# Patient Record
Sex: Female | Born: 1935 | Race: White | Hispanic: No | Smoking: Never smoker
Health system: Southern US, Community
[De-identification: ages and names within clinical notes are randomized; demographics above are authoritative.]

## PROBLEM LIST (undated history)

## (undated) DIAGNOSIS — E785 Hyperlipidemia, unspecified: Secondary | ICD-10-CM

## (undated) DIAGNOSIS — E039 Hypothyroidism, unspecified: Secondary | ICD-10-CM

## (undated) DIAGNOSIS — F0390 Unspecified dementia without behavioral disturbance: Secondary | ICD-10-CM

## (undated) DIAGNOSIS — R7303 Prediabetes: Secondary | ICD-10-CM

## (undated) DIAGNOSIS — I10 Essential (primary) hypertension: Secondary | ICD-10-CM

## (undated) DIAGNOSIS — M1990 Unspecified osteoarthritis, unspecified site: Secondary | ICD-10-CM

## (undated) HISTORY — DX: Unspecified osteoarthritis, unspecified site: M19.90

## (undated) HISTORY — DX: Hypothyroidism, unspecified: E03.9

## (undated) HISTORY — DX: Prediabetes: R73.03

## (undated) HISTORY — DX: Essential (primary) hypertension: I10

## (undated) HISTORY — DX: Hyperlipidemia, unspecified: E78.5

## (undated) HISTORY — PX: FLEXIBLE SIGMOIDOSCOPY: SHX1649

## (undated) HISTORY — PX: CARDIAC CATHETERIZATION: SHX172

---

## 1983-08-11 HISTORY — PX: TOTAL ABDOMINAL HYSTERECTOMY: SHX209

## 1983-08-11 HISTORY — PX: CORNEAL TRANSPLANT: SHX108

## 1988-08-10 HISTORY — PX: MYRINGOTOMY: SHX2060

## 1989-08-10 HISTORY — PX: CORNEAL TRANSPLANT: SHX108

## 1993-08-10 HISTORY — PX: KNEE ARTHROSCOPY: SUR90

## 1998-01-15 ENCOUNTER — Ambulatory Visit (HOSPITAL_COMMUNITY): Admission: RE | Admit: 1998-01-15 | Discharge: 1998-01-15 | Payer: Self-pay | Admitting: *Deleted

## 1998-11-19 ENCOUNTER — Other Ambulatory Visit: Admission: RE | Admit: 1998-11-19 | Discharge: 1998-11-19 | Payer: Self-pay | Admitting: *Deleted

## 1999-01-17 ENCOUNTER — Ambulatory Visit (HOSPITAL_COMMUNITY): Admission: RE | Admit: 1999-01-17 | Discharge: 1999-01-17 | Payer: Self-pay | Admitting: *Deleted

## 1999-08-11 HISTORY — PX: RETINAL DETACHMENT SURGERY: SHX105

## 1999-08-11 HISTORY — PX: ENTEROCELE REPAIR: SHX623

## 1999-11-27 ENCOUNTER — Other Ambulatory Visit: Admission: RE | Admit: 1999-11-27 | Discharge: 1999-11-27 | Payer: Self-pay | Admitting: *Deleted

## 2000-01-26 ENCOUNTER — Ambulatory Visit (HOSPITAL_COMMUNITY): Admission: RE | Admit: 2000-01-26 | Discharge: 2000-01-26 | Payer: Self-pay | Admitting: *Deleted

## 2000-07-15 ENCOUNTER — Encounter (INDEPENDENT_AMBULATORY_CARE_PROVIDER_SITE_OTHER): Payer: Self-pay | Admitting: Specialist

## 2000-07-15 ENCOUNTER — Inpatient Hospital Stay (HOSPITAL_COMMUNITY): Admission: RE | Admit: 2000-07-15 | Discharge: 2000-07-18 | Payer: Self-pay | Admitting: Gynecology

## 2000-11-29 ENCOUNTER — Encounter: Admission: RE | Admit: 2000-11-29 | Discharge: 2000-11-29 | Payer: Self-pay | Admitting: Internal Medicine

## 2000-11-29 ENCOUNTER — Encounter: Payer: Self-pay | Admitting: Internal Medicine

## 2001-03-11 ENCOUNTER — Encounter: Payer: Self-pay | Admitting: Internal Medicine

## 2001-03-11 ENCOUNTER — Ambulatory Visit (HOSPITAL_COMMUNITY): Admission: RE | Admit: 2001-03-11 | Discharge: 2001-03-11 | Payer: Self-pay | Admitting: Internal Medicine

## 2001-03-13 ENCOUNTER — Encounter: Payer: Self-pay | Admitting: Emergency Medicine

## 2001-03-13 ENCOUNTER — Emergency Department (HOSPITAL_COMMUNITY): Admission: EM | Admit: 2001-03-13 | Discharge: 2001-03-13 | Payer: Self-pay | Admitting: Unknown Physician Specialty

## 2001-06-01 ENCOUNTER — Other Ambulatory Visit: Admission: RE | Admit: 2001-06-01 | Discharge: 2001-06-02 | Payer: Self-pay | Admitting: Obstetrics and Gynecology

## 2001-06-06 ENCOUNTER — Encounter: Admission: RE | Admit: 2001-06-06 | Discharge: 2001-06-29 | Payer: Self-pay | Admitting: Orthopedic Surgery

## 2001-06-29 ENCOUNTER — Encounter: Payer: Self-pay | Admitting: Obstetrics and Gynecology

## 2001-06-29 ENCOUNTER — Encounter: Admission: RE | Admit: 2001-06-29 | Discharge: 2001-06-29 | Payer: Self-pay | Admitting: Obstetrics and Gynecology

## 2001-08-17 ENCOUNTER — Ambulatory Visit (HOSPITAL_COMMUNITY): Admission: RE | Admit: 2001-08-17 | Discharge: 2001-08-17 | Payer: Self-pay | Admitting: Gastroenterology

## 2002-08-10 HISTORY — PX: LIPOMA EXCISION: SHX5283

## 2002-09-22 ENCOUNTER — Ambulatory Visit (HOSPITAL_COMMUNITY): Admission: RE | Admit: 2002-09-22 | Discharge: 2002-09-22 | Payer: Self-pay | Admitting: Internal Medicine

## 2002-09-22 ENCOUNTER — Encounter: Payer: Self-pay | Admitting: Internal Medicine

## 2002-10-13 ENCOUNTER — Ambulatory Visit (HOSPITAL_BASED_OUTPATIENT_CLINIC_OR_DEPARTMENT_OTHER): Admission: RE | Admit: 2002-10-13 | Discharge: 2002-10-13 | Payer: Self-pay | Admitting: Surgery

## 2002-11-16 ENCOUNTER — Ambulatory Visit (HOSPITAL_COMMUNITY): Admission: RE | Admit: 2002-11-16 | Discharge: 2002-11-16 | Payer: Self-pay | Admitting: *Deleted

## 2002-12-01 ENCOUNTER — Encounter (INDEPENDENT_AMBULATORY_CARE_PROVIDER_SITE_OTHER): Payer: Self-pay | Admitting: *Deleted

## 2002-12-01 ENCOUNTER — Ambulatory Visit (HOSPITAL_BASED_OUTPATIENT_CLINIC_OR_DEPARTMENT_OTHER): Admission: RE | Admit: 2002-12-01 | Discharge: 2002-12-01 | Payer: Self-pay | Admitting: Surgery

## 2003-06-21 ENCOUNTER — Ambulatory Visit (HOSPITAL_COMMUNITY): Admission: RE | Admit: 2003-06-21 | Discharge: 2003-06-21 | Payer: Self-pay | Admitting: Internal Medicine

## 2003-10-05 ENCOUNTER — Emergency Department (HOSPITAL_COMMUNITY): Admission: EM | Admit: 2003-10-05 | Discharge: 2003-10-05 | Payer: Self-pay | Admitting: Emergency Medicine

## 2005-06-09 ENCOUNTER — Ambulatory Visit (HOSPITAL_COMMUNITY): Admission: RE | Admit: 2005-06-09 | Discharge: 2005-06-09 | Payer: Self-pay | Admitting: Internal Medicine

## 2005-06-17 ENCOUNTER — Encounter: Admission: RE | Admit: 2005-06-17 | Discharge: 2005-06-17 | Payer: Self-pay | Admitting: Internal Medicine

## 2006-07-20 ENCOUNTER — Ambulatory Visit (HOSPITAL_COMMUNITY): Admission: RE | Admit: 2006-07-20 | Discharge: 2006-07-20 | Payer: Self-pay | Admitting: Internal Medicine

## 2007-07-30 ENCOUNTER — Emergency Department (HOSPITAL_COMMUNITY): Admission: EM | Admit: 2007-07-30 | Discharge: 2007-07-30 | Payer: Self-pay | Admitting: *Deleted

## 2009-01-17 ENCOUNTER — Encounter: Admission: RE | Admit: 2009-01-17 | Discharge: 2009-01-17 | Payer: Self-pay | Admitting: Internal Medicine

## 2010-02-20 ENCOUNTER — Ambulatory Visit (HOSPITAL_COMMUNITY): Admission: RE | Admit: 2010-02-20 | Discharge: 2010-02-20 | Payer: Self-pay | Admitting: Internal Medicine

## 2010-07-23 ENCOUNTER — Ambulatory Visit (HOSPITAL_COMMUNITY)
Admission: RE | Admit: 2010-07-23 | Discharge: 2010-07-23 | Payer: Self-pay | Source: Home / Self Care | Attending: Internal Medicine | Admitting: Internal Medicine

## 2010-08-31 ENCOUNTER — Encounter: Payer: Self-pay | Admitting: Internal Medicine

## 2010-12-26 NOTE — Op Note (Signed)
[facility]  Patient:    Stephanie Weeks, Stephanie Weeks               MRN: [id number] Proc. Date: 07/15/00 Attending:  Gretta Cool, M.D. CC:         Esmeralda Arthur, M.D.  Amil Amen L. Lenon Ahmadi, M.D.   Operative Report  DATE OF BIRTH:  11/11/1932.  PREOPERATIVE DIAGNOSES:  Grade 4 pelvic organ prolapse with enormous enterocele and rectocele, anterior enterocele, bilateral paravaginal defects, vaginal vault prolapse.  POSTOPERATIVE DIAGNOSES:  Grade 4 pelvic organ prolapse with enormous enterocele and rectocele, anterior enterocele, bilateral paravaginal defects, vaginal vault prolapse plus small bowel adhesion in the enterocele sac.  PROCEDURES:  Paravaginal plus Burch procedure, posterior and enterocele repairs with release of small bowel adhesion from the enterocele sac, vaginal vault suspension.  SURGEON:  Gretta Cool, M.D.  ASSISTANT:  Raynald Kemp, M.D.  ANESTHESIA:  General endotracheal.  DESCRIPTION OF PROCEDURE:  Under excellent anesthesia, as above, with the patients prepped and draped in Allen stirrups, a Pfannenstiel incision was made and extended through the fascia.  The rectus muscles were then separated in the midline and the retroperitoneal space opened.  The adventitial fatty tissue was then removed so as to expose clean fascia.  Bilateral paravaginal defects were noted.  The white line fascia of the pelvis was identified and the ischial spines and other landmarks identified.  Sutures were then placed approximately a centimeter apart from a centimeter below the ischial spine through the endopelvic fascia, full thickness, and each suture was secured to the white line fascia of the pelvis at its point of detachment; approximately seven sutures were placed on the right and six on the left; 0 Ethibond was used.  The Burch procedure was then undertaken with two sutures placed on each side with 0 Ethibond, secured to Coopers  ligament.  At the end of the procedure, the anterior vaginal wall was well-supported, cystocele was completely reduced and the level 2 support restored.  The urethra and vesical neck were elevated to a high intra-abdominal position.  At this point, bleeding was well-controlled.  The retropubic space was irrigated and the rectus muscles then plicated in the midline with a running suture of 0  Monocryl, the fascia was approximated with 0 Vicryl from each angle to the midline, subcutaneous tissue was approximated with interrupted sutures of 3-0 Vicryl and the skin was closed with skin staples and Steri-Strips.  At this point, attention was turned to the repair of the enormous enterocele and rectocele and vaginal vault suspension.  The patient was repositioned and the vaginal portion begun.  The vaginal mucosa was grasped at the introitus and incised from the introitus to the apex of the vaginal cuff.  The mucosa was then dissected free from the underlying endopelvic fascia and perirectal fascia.  An enormous enterocele was encountered and that bulged through the introitus.  There was evidence of anterior enterocele as well and detachment of the anterior vaginal wall fascia from the apex of the cuff.  The apex of the cuff was dissected free and the enterocele sac was released.  Upon opening the enterocele sac, a loop of small bowel was identified, adherent to the very depth of the enterocele sac.  The small bowel loop was released and the serosa repaired with a single interrupted suture of 3-0 Monocryl.  The bowel loop was released high into the abdomen.  The enterocele sac was then ligated as high as possible with a  running pursestring suture of 0 Monocryl.  The cardinal-uterosacral complex was then identified and plicated in the midline with interrupted sutures of 0 Ethibond.  The endopelvic fascia was then closed with reapproximation of the anterior vaginal wall fascia to the posterior  perirectal fascia.  The cuff was then secured as high as possible behind the uterosacral-cardinal complex suspension.  The fascia was secured to the uterosacral-cardinal complex suspension with interrupted sutures of 0 Vicryl.  The perirectal fascia was then plicated from the apex of the vagina to the introitus using 0 Vicryl. The mucosa were then trimmed and the upper layers of endopelvic fascia approximated with a subcuticular closure of 2-0 Vicryl, including the upper layers of endopelvic fascia.  At the end of the procedure, the vagina remained an adequate bi-digital introitus with excellent caliber and length.  At this point, the bladder was filled with approximately 400 cc of irrigation fluid and Bonnano suprapubic Cystocath placed.  The catheter was secured with interrupted sutures of 0 Ethibond.  At the end of the procedure, sponge and lap counts were correct.  There were no complications.  Blood loss estimate: Approximately 300 cc.  Patient returned to the recovery room in excellent condition without complication. DD:  07/15/00 TD:  07/16/00 Job: [id number]

## 2010-12-26 NOTE — H&P (Signed)
[facility]  Patient:    Stephanie Weeks, Stephanie Weeks               MRN: [id number] Attending:  Gretta Weeks, M.D. CC:         Stephanie Weeks, M.D.  Stephanie Weeks, M.D.   History and Physical  REASON FOR ADMISSION:  [name] is referred by Dr. Esmeralda Weeks for evaluation and treatment of pelvic organ prolapse.  HISTORY OF PRESENT ILLNESS:  [age]-year-old gravida 4, para 4, referred for severe pelvic organ prolapse.  She has a history of hysterectomy in 1985, total abdominal, and right salpingo-oophorectomy and cautery of endometriosis. She did well until approximately four years ago, when she began to experience symptoms of pelvic organ prolapse.  She has had progressive relaxation to the point now of grade 4 prolapse of vaginal vault and enterocele.  She has used a donut-type pessary, which she was unable to tolerate; she subsequently had it removed.  Over the last several years, she has had progressively worsening symptoms and now has severe pain with prolonged standing; she also has a mixed pattern of incontinence with both urge and stress component.  Her stress component is by far worse.  She has very frequent voiding and difficulty emptying, more than eight times daily.  She has to splint to empty her rectum and occasionally to empty her bladder.  She requires pads at all times.  She has pain with intercourse to the point of no longer participating.   She has difficulty evacuating her bowels and chronic constipation.  On assessment in our office, she has a high-grade cystocele with bilateral paravaginal defects and enormous enterocele with prolapse of the vaginal vault to the introitus. Her enterocele bulges approximately 4 or 5 cm beyond the introitus with straining.  She has had a Milex ring pessary placed as a barrier test and although it reduces the enterocele, it worsened her incontinence.  She has normal bladder  capacity and only a 10 cc residual urine volume.  She is now admitted for surgical repair.  PAST MEDICAL HISTORY:  Usual childhood diseases without sequelae.  Medical illnesses:  Severe eye problems with recurrent surgery for cataracts with lens implants, corneal transplant, therapy for glaucoma and retinal detachment. She is under the primary care of Dr. Amil Amen L. Weeks.  She has a history of hypertension, on verapamil.  She is hypothyroid, on replacement with Synthroid 0.05 mg daily, on hormone-replacement therapy with Premarin 0.625 mg and vaginal estrogen, takes Lorazepam 1 mg one and a half tablets for sleep.  PREVIOUS SURGERY:  Hysterectomy in 1985, abdominal, and right salpingo-oophorectomy; multiple eye surgeries, 1985 to 1999; surgery for breast lumps, 1987 and 1989, benign, by Dr. Sheppard Weeks. Stephanie Weeks.  FAMILY HISTORY:  Father died of congestive heart failure.  Mother died of stroke.  She has a strongly positive family history for coronary disease and hypertension.  One sister with ovarian cancer.  SOCIAL HISTORY:  Patient is a retired Pensions consultant for [employer]. Husband works for [employer].  REVIEW OF SYSTEMS:  HEENT:  Denies symptoms.  CARDIORESPIRATORY:  Denies asthma, cough, bronchitis, shortness of breath.  GI/GU:  Denies frequency, urgency, dysuria, change in bowel habits, food intolerance.  She has a hiatal hernia with reflux and is on Prilosec from Stephanie Weeks; also has a history of spastic colon, treated by Dr. Matthias Weeks.  PHYSICAL EXAMINATION  GENERAL:  Well-developed, well-nourished white female.  HEENT:  Pupils unequal; both react to  light but her right pupil is more fixed -- sequelae of multiple eye surgeries, retinal detachment, right eye.  NECK:  Supple without mass or thyroid enlargement.  CHEST:  Clear P to A.  HEART:  Regular rhythm without murmur or cardiac enlargement.  BREASTS:  Soft, without mass, nodes or nipple  discharge.  ABDOMEN:  Soft, scaphoid.  Without mass or organomegaly.  Previous Pfannenstiel incision.  PELVIC:  External genitalia:  Normal female.  Vagina:  Severe pelvic organ prolapse with grade 4 enterocele with prolapse of the vaginal vault to and beyond the introitus; probable anterior enterocele as well with bilateral paravaginal defects and large rectocele.  Cervix and uterus are surgically absent.  Adnexa nonpalpable.  RECTAL:  Her sphincter remains intact.  Rectovaginal exam confirms.  IMPRESSIONS 1. Severe pelvic organ prolapse with enterocele, grade 4; vaginal vault    prolapse, grade 3; anterior enterocele; bilateral paravaginal defects; and    rectocele, grade 3. 2. History of hysterectomy in 1985 for endometriosis, and right    salpingo-oophorectomy. 3. Mixed incontinence secondary to above, predominantly stress. 4. History of retinal detachment with multiple eye surgeries and lens    implants. 5. History of hiatal hernia. 6. History of irritable bowel syndrome. 7. History of abnormal genital cytology, resolved. 8. Hypertension. 9. Hypothyroid.  PLAN:  Patient had preoperative cardiology consultation and clearance by Dr. Vesta Weeks, Stephanie Weeks.  She understands the alternatives including no therapy and use of pessary.  Because of worsened incontinence, she has petitioned for surgical repairs.  She is now admitted for paravaginal plus Burch procedure, vaginal vault suspension, posterior and enterocele repairs. She understands the risk/benefit ratio, the medical risks, alternatives and now admitted for definitive therapy. DD:  07/15/00 TD:  07/16/00 Job: [id number]

## 2010-12-26 NOTE — Procedures (Signed)
[facility]  Patient:    RAVIN, BENDALL Visit Number: [id number] MRN: [id number]          Service Type: END Location: ENDO Attending Physician:  Rich Brave Dictated by:   Florencia Reasons, M.D. Proc. Date: 08/17/01 Admit Date:  08/17/2001   CC:         Marinus Maw, M.D.  Gretta Cool, M.D.   Procedure Report  PROCEDURE:  Colonoscopy.  ENDOSCOPIST:  Florencia Reasons, M.D.  INDICATION:  [age]-year-old female with prior history of colonic adenoma removed on colonoscopy about six years ago.  FINDINGS:  Normal exam to the cecum.  DESCRIPTION OF PROCEDURE:  The nature, purpose and risks of the procedure were familiar to the patient from prior examination; she provided written consent. Sedation was fentanyl 125 mcg and Versed 12 mg IV, without arrhythmias or desaturation.  The Olympus adjustable-tension pediatric video colonoscope was advanced without too much difficulty to the cecum, taking out loops to facilitate advancement.  The cecum was identified by clear visualization of the ileocecal valve.  Pullback was then performed.  The quality of the prep was very good and it was felt that all areas were well-seen.  This was a normal examination.  No polyps, cancer, colitis, vascular malformations or diverticulosis were observed.  Retroflexion in the rectum showed some hypertrophied anal papillae but was otherwise normal.  No biopsies were obtained.  The patient tolerated the procedure well and there were no apparent complications.  IMPRESSION:  Normal surveillance colonoscopy in a patient with a prior history of a small colonic adenoma.  PLAN:  Followup colonoscopy in five years. Dictated by:   Florencia Reasons, M.D. Attending Physician:  Rich Brave DD:  08/17/01 TD:  08/17/01 Job: [id number]

## 2010-12-26 NOTE — Op Note (Signed)
   NAME:  Stephanie Weeks, Stephanie Weeks NO.:  [id number]   MEDICAL RECORD NO.:  [id number]                   PATIENT TYPE:  AMB   LOCATION:                                       FACILITY:  [facility]   PHYSICIAN:  Thornton Park. Daphine Deutscher, M.D.             DATE OF BIRTH:  [birth year]   DATE OF PROCEDURE:  12/01/2002  DATE OF DISCHARGE:                                 OPERATIVE REPORT   POSTOPERATIVE DIAGNOSIS:  Two masses, left portion of back.   POSTOPERATIVE DIAGNOSIS:  Lateral lipoma, medial chronic draining sebaceous  cyst.   PROCEDURE:  Excision of two masses of back.   SURGEON:  Thornton Park. Daphine Deutscher, M.D.   ANESTHESIA:  MAC with local.   DESCRIPTION OF PROCEDURE:  The patient is a 77 year old lady who was taken  to room eight, [facility] day surgery, on April 23 and placed in the slightly  elevated prone position. The back was prepped with Betadine and draped  sterilely.  Previously, I had localized these areas.  I infiltrated the  lipoma area first with 1% lidocaine and made a small transverse incision and  was able to dissect this down and shell it out.  Bleeding was controlled  with the electrocautery.  The wound was closed with 4-0 Vicryl  subcutaneously and subcuticularly with Benzoin and Steri-Strips.   Next, again through a transverse incision I infiltrated this large dilated  pore that had some material trying to drain out of it. I injected this with  a little lidocaine and then made an elliptical incision and excised this  whole chronically inflamed thing.  This mass was sent for permanent  sections. Again, the wound had bleeding controlled with ithe electrocautery  and it was closed with 4-0 Vicryl subcutaneously with Benzoin and Steri-  Strips.  The patient seemed to tolerate the procedure well.  Sterile  dressings were applied.  The patient was given Vicodin to take for pain and  will be followed up in the office in approximately three weeks.                                Thornton Park Daphine Deutscher, M.D.    MBM/MEDQ  D:  12/01/2002  T:  12/03/2002  Job:  [id number]   cc:   Jeoffrey Massed, M.D.  [facility]  [place], [place] [address]  Fax: [phone]

## 2010-12-26 NOTE — Cardiovascular Report (Signed)
NAME:  Stephanie Weeks, Stephanie Weeks                ACCOUNT NO.:  [id number]   MEDICAL RECORD NO.:  [id number]                   PATIENT TYPE:  OIB   LOCATION:  2899                                 FACILITY:  [facility]   PHYSICIAN:  Veneda Melter, M.D. [facility]               DATE OF BIRTH:  1933/02/13   DATE OF PROCEDURE:  11/16/2002  DATE OF DISCHARGE:  11/16/2002                              CARDIAC CATHETERIZATION   PROCEDURES PERFORMED:  1. Left heart catheterization.  2. Left ventriculogram.  3. Selective coronary angiography.  4. Abdominal aortogram.  5. Perclose, right femoral artery.   DIAGNOSES:  1. Mild coronary artery disease by angiogram.  2. Normal left ventricular systolic function.   HISTORY:  The patient is a 77 year old white female who presented for  assessment prior to resection of a left scapular mass.  The patient had an  abnormal ECG and nondescript dyspnea on exertion.  She underwent a stress  imaging study, suggesting ischemia in the anterior apical wall.  She  presents now for further assessment.   TECHNIQUE:  Informed consent was obtained. The patient was brought to the  catheterization lab.  A 6-French sheath was placed in the right femoral  artery using modified Seldinger technique. A 6-French JL4 and JR4 catheters  were then used to engage the left and right coronary arteries.  Selective  angiography was then performed using manual injection of contrast.  Left-  sided pigtail catheter was advanced to the left ventricle, and a left  ventriculogram was then performed using power injection of contrast.  The  pigtail catheter was brought back into the descending aorta, and an  abdominal aortogram performed using power injections of contrast.  At the  termination of the case, the catheters and sheaths were removed, and the  Perclose suture closure device deployed to the right femoral artery until  adequate hemostasis was achieved.  The patient tolerated the procedure  well  and was transferred to the floor in stable condition.   FINDINGS:  1. Left main trunk short angiograph and normal.  2. LAD.  This is a medium-caliber vessel; it provides a large diagonal     branch in the proximal segment and then extends to the apex.  There is a     mild concentric narrowing of 30% in the mid section after the take off of     a major septal perforator.  The distal LAD has mild corkscrewing and     tapers as it wraps around the apex.  3. Left circumflex artery.  This is codominant and a large-caliber vessel     that provides two marginal branches to the proximal segment, two     posteroventricular branches, and a small PDA distally.  The left     circumflex system has mild disease; there are narrowings of 30% in the     first two marginal branches in the proximal segment.  4. Right coronary artery is codominant.  This is a small-caliber vessel that     provides a large RV marginal branch, which extends to the distal inferior     septum.  Mild tortuosity is noted in the mid right coronary artery as     well as in the distal segment of the RV marginal branch, and there are     mild irregularities, not greater than 20 to 30%.  5. LD.  Normal end-systolic and end-diastolic dimensions.  Overall, left     ventricular function is well preserved.  Ejection fraction is greater     than 55%.  There is no mitral regurgitation.  LV pressure is 160/10,     aortic is 160/75, LVEDP equals 15.  6. Abdominal aorta is of a normal caliber.  There is mild atheromatous     buildup without critical stenosis or dilatation.  Iliac arteries are     patent bilaterally.  The renal arteries are single and mildly patent     bilaterally.   ASSESSMENT/PLAN:  The patient is a 77 year old white female with mild  coronary artery disease and well-preserved LV function by angiogram.  The  patient is considered at low risk for cardiovascular events during her  planned surgery, and continued medical  therapy will be pursued to reduce her  risks of future cardiovascular events.                                               Veneda Melter, M.D. [facility]    NG/MEDQ  D:  11/16/2002  T:  11/17/2002  Job:  [id number]   cc:   Lucky Cowboy, M.D.  [address], [address]  [place], [place] [address]  Fax: [phone]   Dr. Daphine Deutscher, [facility]

## 2010-12-26 NOTE — Discharge Summary (Signed)
[facility]  Patient:    Stephanie Weeks, Stephanie Weeks               MRN: [id number] Adm. Date:  07/15/00 Disc. Date: 07/18/00 Attending:  Gretta Cool, M.D. CC:         Esmeralda Arthur, M.D.  Amil Amen L. Lenon Ahmadi, M.D.   Discharge Summary  HISTORY OF PRESENT ILLNESS:  The patient is a 77 year old female, G4, P4 who was referred to our office by Dr. Katrinka Blazing for severe pelvic organ prolapse.  In 1985, she had a total abdominal hysterectomy and a right salpingo-oophorectomy and cautery of endometriosis.  She apparently did well for four years and then began to experience pelvic organ prolapse.  She has had progressive relaxation to the point of grade 4 with vaginal vault and enterocele.  Doughnut type pessary was attempted to relieve symptoms without success.  She now returns with severe pain with prolonged standing as well as mixed pattern of incontinence with both urge and stress component.  She reports very frequent voiding and difficulty emptying which she has to splint to empty her rectum and occasionally to empty her bladder.  She wears pads at all times.  She has pain with intercourse to the point that she no longer can tolerate it.  Upon examination in the office, she has a high grade cystocele with bilateral paravaginal defects and enormous enterocele with prolapse of the vaginal vault to the introitus.  The enterocele bulge is approximately 4-5 cm beyond the introitus with straining.  She has a Milex ring pessary placed as a barrier test and although it reduces the enterocele, it worsens her incontinence.  She has normal bladder capacity with only 10 cc residual urine volume.  She is now admitted for surgical repair.  PHYSICAL EXAMINATION:  CHEST:  Clear to auscultation and percussion.  HEART:  Regular rate and rhythm without murmurs, gallop or cardiac enlargement.  ABDOMEN:  Soft, scaphoid without masses or organomegaly.  Previous Pfannenstiel  incision.  PELVIC:  External genitalia within normal limits.  Normal female vagina with severe pelvic organ prolapse with a grade 4 enterocele with prolapse of the vaginal vault and beyond the introitus.  Probable enterocele as well as paravaginal defects and a large rectocele.  Cervix and uterus are surgically absent.  Adnexa nonpalpable.  Rectal: Sphincter remains intact.  IMPRESSION: 1. Severe pelvic organ prolapse with enterocele grade 4, vaginal vault    prolapse grade 3, bilateral paravaginal defects and rectocele grade 3. 2. History of hysterectomy in 1985, for endometriosis and right    salpingo-oophorectomy. 3. Mixed incontinence secondary to above, predominantly stress. 4. History of retinal detachment with multiple eye surgeries and lens    implants. 5. History of hiatal hernia. 6. History of irritable bowel syndrome. 7. History of abnormal genital cytology resolved. 8. Hypertension. 9. Hypothyroid.  PLAN:  Preoperative cardiology consult and clearance.  Risk and benefits of the procedures have been discussed with the patient and she accepts these procedures.  LABORATORY DATA AND X-RAY FINDINGS:  Admission hemoglobin 13.9, hematocrit 39.6, white count 6.5.  On the first postop day, hemoglobin 10.8, hematocrit 30.6 and white count of 9.4.  The remainder of her preoperative lab work was within normal limits.  Blood type A negative with a negative antibody screen. EKG with normal sinus rhythm and left axis deviation.  Abnormal ECG.  HOSPITAL COURSE:  The patient underwent paravaginal plus Birch procedures, posterior enterocele repairs with release of small bowel adhesions from the enterocele sac  and vaginal vault suspension under general anesthesia.  The procedures were completed without any complication and the patient was returned to the recovery room in excellent condition.  Estimated blood loss was 300 cc.  Pathology report showed enterocele sac to have  benign fibromuscular connective tissue.  Her postoperative course was complicated by urinary retention.  She was discharged on postop day #3 in satisfactory condition.  ACTIVITY:  No heavy lifting or straining, no vaginal entrance and increase ambulation as tolerated.  SPECIAL INSTRUCTIONS:  She is to call for fever of over 100.5 or failure of daily improvement.  DIET:  Regular.  DISCHARGE MEDICATIONS:  Preoperative medications plus Vioxx 25 mg daily and Darvocet-N 100 one p.o. q.4h. p.r.n. discomfort.  FOLLOWUP:  She is to return to the office in one week for followup.  CONDITION ON DISCHARGE:  Excellent.  DISCHARGE DIAGNOSES: 1. Grade 4 pelvic organ prolapse with enormous enterocele and rectocele. 2. Anterior enterocele, bilateral paravaginal defects and vaginal vault    prolapse plus small bowel adhesion to the enterocele sac.  PROCEDURE: 1. Paravaginal plus Birch procedure. 2. Posterior enterocele repairs with release of small bowel adhesions from the    enterocele sac. 3. Vaginal vault suspension under general anesthesia. DD:  08/09/00 TD:  08/09/00 Job: [id number]

## 2011-05-28 ENCOUNTER — Other Ambulatory Visit (HOSPITAL_COMMUNITY): Payer: Self-pay | Admitting: Internal Medicine

## 2011-05-28 DIAGNOSIS — Z1231 Encounter for screening mammogram for malignant neoplasm of breast: Secondary | ICD-10-CM

## 2011-06-04 ENCOUNTER — Ambulatory Visit (HOSPITAL_COMMUNITY)
Admission: RE | Admit: 2011-06-04 | Discharge: 2011-06-04 | Disposition: A | Discharge status: Home / Self Care | Payer: Medicare Other | Source: Ambulatory Visit | Attending: Internal Medicine | Admitting: Internal Medicine

## 2011-06-04 DIAGNOSIS — Z1231 Encounter for screening mammogram for malignant neoplasm of breast: Secondary | ICD-10-CM | POA: Insufficient documentation

## 2011-10-30 ENCOUNTER — Other Ambulatory Visit: Payer: Self-pay | Admitting: Internal Medicine

## 2011-10-30 DIAGNOSIS — R109 Unspecified abdominal pain: Secondary | ICD-10-CM

## 2011-10-30 DIAGNOSIS — I719 Aortic aneurysm of unspecified site, without rupture: Secondary | ICD-10-CM

## 2011-11-04 ENCOUNTER — Ambulatory Visit
Admission: RE | Admit: 2011-11-04 | Discharge: 2011-11-04 | Disposition: A | Discharge status: Home / Self Care | Payer: Medicare Other | Source: Ambulatory Visit | Attending: Internal Medicine | Admitting: Internal Medicine

## 2011-11-04 DIAGNOSIS — R109 Unspecified abdominal pain: Secondary | ICD-10-CM

## 2011-11-04 DIAGNOSIS — I719 Aortic aneurysm of unspecified site, without rupture: Secondary | ICD-10-CM

## 2012-04-19 ENCOUNTER — Other Ambulatory Visit: Payer: Self-pay | Admitting: Gynecology

## 2013-04-03 ENCOUNTER — Other Ambulatory Visit: Payer: Self-pay

## 2013-04-03 DIAGNOSIS — Z1231 Encounter for screening mammogram for malignant neoplasm of breast: Secondary | ICD-10-CM

## 2013-04-21 ENCOUNTER — Ambulatory Visit
Admission: RE | Admit: 2013-04-21 | Discharge: 2013-04-21 | Disposition: A | Discharge status: Home / Self Care | Payer: Medicare Other | Source: Ambulatory Visit

## 2013-04-21 DIAGNOSIS — Z1231 Encounter for screening mammogram for malignant neoplasm of breast: Secondary | ICD-10-CM

## 2013-04-21 LAB — HM MAMMOGRAPHY: HM Mammogram: NORMAL

## 2013-06-26 ENCOUNTER — Other Ambulatory Visit: Payer: Self-pay | Admitting: Physician Assistant

## 2013-06-26 NOTE — Telephone Encounter (Signed)
RX for Ativan 1mg  one po three times daily called in #[phone] 1 refill per Marchelle Folks

## 2013-07-19 ENCOUNTER — Encounter: Payer: Self-pay | Admitting: Internal Medicine

## 2013-07-21 ENCOUNTER — Ambulatory Visit (INDEPENDENT_AMBULATORY_CARE_PROVIDER_SITE_OTHER): Payer: 59 | Admitting: Emergency Medicine

## 2013-07-21 ENCOUNTER — Encounter: Payer: Self-pay | Admitting: Emergency Medicine

## 2013-07-21 VITALS — BP 132/78 | HR 62 | Temp 98.2°F | Resp 18 | Ht 65.5 in | Wt 125.0 lb

## 2013-07-21 DIAGNOSIS — I10 Essential (primary) hypertension: Secondary | ICD-10-CM | POA: Insufficient documentation

## 2013-07-21 DIAGNOSIS — M1711 Unilateral primary osteoarthritis, right knee: Secondary | ICD-10-CM

## 2013-07-21 DIAGNOSIS — R7309 Other abnormal glucose: Secondary | ICD-10-CM

## 2013-07-21 DIAGNOSIS — R7303 Prediabetes: Secondary | ICD-10-CM | POA: Insufficient documentation

## 2013-07-21 DIAGNOSIS — E039 Hypothyroidism, unspecified: Secondary | ICD-10-CM | POA: Insufficient documentation

## 2013-07-21 DIAGNOSIS — M1990 Unspecified osteoarthritis, unspecified site: Secondary | ICD-10-CM | POA: Insufficient documentation

## 2013-07-21 DIAGNOSIS — E785 Hyperlipidemia, unspecified: Secondary | ICD-10-CM

## 2013-07-21 DIAGNOSIS — E782 Mixed hyperlipidemia: Secondary | ICD-10-CM

## 2013-07-21 LAB — LIPID PANEL
Cholesterol: 176 mg/dL (ref 0–200)
HDL: 61 mg/dL (ref >39)
Total CHOL/HDL Ratio: 2.9 Ratio

## 2013-07-21 LAB — URIC ACID: Uric Acid, Serum: 2.7 mg/dL (ref 2.4–7.0)

## 2013-07-21 LAB — CBC WITH DIFFERENTIAL/PLATELET
Basophils Absolute: 0 10*3/uL (ref 0.0–0.1)
Basophils Relative: 1 % (ref 0–1)
Eosinophils Relative: 2 % (ref 0–5)
Lymphocytes Relative: 39 % (ref 12–46)
Monocytes Absolute: 0.5 10*3/uL (ref 0.1–1.0)
Monocytes Relative: 9 % (ref 3–12)
Neutro Abs: 3.2 10*3/uL (ref 1.7–7.7)
Neutrophils Relative %: 49 % (ref 43–77)
Platelets: 306 10*3/uL (ref 150–400)

## 2013-07-21 LAB — BASIC METABOLIC PANEL WITH GFR
CO2: 27 mEq/L (ref 19–32)
Calcium: 9.5 mg/dL (ref 8.4–10.5)
Creat: 0.6 mg/dL (ref 0.50–1.10)
GFR, Est African American: >89 mL/min
Sodium: 136 mEq/L (ref 135–145)

## 2013-07-21 LAB — HEMOGLOBIN A1C: Hgb A1c MFr Bld: 5.6 % (ref <5.7)

## 2013-07-21 LAB — HEPATIC FUNCTION PANEL
AST: 20 U/L (ref 0–37)
Alkaline Phosphatase: 61 U/L (ref 39–117)
Indirect Bilirubin: 0.4 mg/dL (ref 0.0–0.9)
Total Bilirubin: 0.5 mg/dL (ref 0.3–1.2)

## 2013-07-21 NOTE — Patient Instructions (Signed)
Knee Exercises EXERCISES RANGE OF MOTION(ROM) AND STRETCHING EXERCISES These exercises may help you when beginning to rehabilitate your injury. Your symptoms may resolve with or without further involvement from your physician, physical therapist or athletic trainer. While completing these exercises, remember:   Restoring tissue flexibility helps normal motion to return to the joints. This allows healthier, less painful movement and activity.  An effective stretch should be held for at least 30 seconds.  A stretch should never be painful. You should only feel a gentle lengthening or release in the stretched tissue. STRETCH - Knee Extension, Prone  Lie on your stomach on a firm surface, such as a bed or countertop. Place your right / left knee and leg just beyond the edge of the surface. You may wish to place a towel under the far end of your right / left thigh for comfort.  Relax your leg muscles and allow gravity to straighten your knee. Your clinician may advise you to add an ankle weight if more resistance is helpful for you.  You should feel a stretch in the back of your right / left knee. Hold this position for __________ seconds. Repeat __________ times. Complete this stretch __________ times per day. * Your physician, physical therapist or athletic trainer may ask you to add ankle weight to enhance your stretch.  RANGE OF MOTION - Knee Flexion, Active  Lie on your back with both knees straight. (If this causes back discomfort, bend your opposite knee, placing your foot flat on the floor.)  Slowly slide your heel back toward your buttocks until you feel a gentle stretch in the front of your knee or thigh.  Hold for __________ seconds. Slowly slide your heel back to the starting position. Repeat __________ times. Complete this exercise __________ times per day.  STRETCH - Quadriceps, Prone   Lie on your stomach on a firm surface, such as a bed or padded floor.  Bend your right /  left knee and grasp your ankle. If you are unable to reach, your ankle or pant leg, use a belt around your foot to lengthen your reach.  Gently pull your heel toward your buttocks. Your knee should not slide out to the side. You should feel a stretch in the front of your thigh and/or knee.  Hold this position for __________ seconds. Repeat __________ times. Complete this stretch __________ times per day.  STRETCH  Hamstrings, Supine   Lie on your back. Loop a belt or towel over the ball of your right / left foot.  Straighten your right / left knee and slowly pull on the belt to raise your leg. Do not allow the right / left knee to bend. Keep your opposite leg flat on the floor.  Raise the leg until you feel a gentle stretch behind your right / left knee or thigh. Hold this position for __________ seconds. Repeat __________ times. Complete this stretch __________ times per day.  STRENGTHENING EXERCISES These exercises may help you when beginning to rehabilitate your injury. They may resolve your symptoms with or without further involvement from your physician, physical therapist or athletic trainer. While completing these exercises, remember:   Muscles can gain both the endurance and the strength needed for everyday activities through controlled exercises.  Complete these exercises as instructed by your physician, physical therapist or athletic trainer. Progress the resistance and repetitions only as guided.  You may experience muscle soreness or fatigue, but the pain or discomfort you are trying to eliminate should   never worsen during these exercises. If this pain does worsen, stop and make certain you are following the directions exactly. If the pain is still present after adjustments, discontinue the exercise until you can discuss the trouble with your clinician. STRENGTH - Quadriceps, Isometrics  Lie on your back with your right / left leg extended and your opposite knee bent.  Gradually  tense the muscles in the front of your right / left thigh. You should see either your knee cap slide up toward your hip or increased dimpling just above the knee. This motion will push the back of the knee down toward the floor/mat/bed on which you are lying.  Hold the muscle as tight as you can without increasing your pain for __________ seconds.  Relax the muscles slowly and completely in between each repetition. Repeat __________ times. Complete this exercise __________ times per day.  STRENGTH - Quadriceps, Short Arcs   Lie on your back. Place a __________ inch towel roll under your knee so that the knee slightly bends.  Raise only your lower leg by tightening the muscles in the front of your thigh. Do not allow your thigh to rise.  Hold this position for __________ seconds. Repeat __________ times. Complete this exercise __________ times per day.  OPTIONAL ANKLE WEIGHTS: Begin with ____________________, but DO NOT exceed ____________________. Increase in 1 pound/0.5 kilogram increments.  STRENGTH - Quadriceps, Straight Leg Raises  Quality counts! Watch for signs that the quadriceps muscle is working to insure you are strengthening the correct muscles and not "cheating" by substituting with healthier muscles.  Lay on your back with your right / left leg extended and your opposite knee bent.  Tense the muscles in the front of your right / left thigh. You should see either your knee cap slide up or increased dimpling just above the knee. Your thigh may even quiver.  Tighten these muscles even more and raise your leg 4 to 6 inches off the floor. Hold for __________ seconds.  Keeping these muscles tense, lower your leg.  Relax the muscles slowly and completely in between each repetition. Repeat __________ times. Complete this exercise __________ times per day.  STRENGTH - Hamstring, Curls  Lay on your stomach with your legs extended. (If you lay on a bed, your feet may hang over the  edge.)  Tighten the muscles in the back of your thigh to bend your right / left knee up to 90 degrees. Keep your hips flat on the bed/floor.  Hold this position for __________ seconds.  Slowly lower your leg back to the starting position. Repeat __________ times. Complete this exercise __________ times per day.  OPTIONAL ANKLE WEIGHTS: Begin with ____________________, but DO NOT exceed ____________________. Increase in 1 pound/0.5 kilogram increments.  STRENGTH  Quadriceps, Squats  Stand in a door frame so that your feet and knees are in line with the frame.  Use your hands for balance, not support, on the frame.  Slowly lower your weight, bending at the hips and knees. Keep your lower legs upright so that they are parallel with the door frame. Squat only within the range that does not increase your knee pain. Never let your hips drop below your knees.  Slowly return upright, pushing with your legs, not pulling with your hands. Repeat __________ times. Complete this exercise __________ times per day.  STRENGTH - Quadriceps, Wall Slides  Follow guidelines for form closely. Increased knee pain often results from poorly placed feet or knees.  Lean against   a smooth wall or door and walk your feet out 18-24 inches. Place your feet hip-width apart.  Slowly slide down the wall or door until your knees bend __________ degrees.* Keep your knees over your heels, not your toes, and in line with your hips, not falling to either side.  Hold for __________ seconds. Stand up to rest for __________ seconds in between each repetition. Repeat __________ times. Complete this exercise __________ times per day. * Your physician, physical therapist or athletic trainer will alter this angle based on your symptoms and progress. Document Released: 06/10/2005 Document Revised: 10/19/2011 Document Reviewed: 11/08/2008 [facility] Patient Information 2014 [place], [place]. Knee Pain Knee pain can be a result of an  injury or other medical conditions. Treatment will depend on the cause of your pain. HOME CARE  Only take medicine as told by your doctor.  Keep a healthy weight. Being overweight can make the knee hurt more.  Stretch before exercising or playing sports.  If there is constant knee pain, change the way you exercise. Ask your doctor for advice.  Make sure shoes fit well. Choose the right shoe for the sport or activity.  Protect your knees. Wear kneepads if needed.  Rest when you are tired. GET HELP RIGHT AWAY IF:   Your knee pain does not stop.  Your knee pain does not get better.  Your knee joint feels hot to the touch.  You have a fever. MAKE SURE YOU:   Understand these instructions.  Will watch this condition.  Will get help right away if you are not doing well or get worse. Document Released: 10/23/2008 Document Revised: 10/19/2011 Document Reviewed: 10/23/2008 [facility] Patient Information 2014 [place], [place].

## 2013-07-22 LAB — INSULIN, FASTING: Insulin fasting, serum: 8 u[IU]/mL (ref 3–28)

## 2013-07-23 NOTE — Progress Notes (Signed)
Subjective:    Patient ID: Stephanie Weeks, female    DOB: 1932-09-03, 79 y.o.   MRN: [id number]  HPI Comments: 79 yo female presents for 3 month F/U for HTN, Cholesterol, Pre-Dm, D. Deficient, Hypothyroid. She has been eating healthy. She feels well overall. Mood has been good. She notes BP is good at home. She has not been exercising regularly with right knee pain and has noticed mild decreased energy with decreased activity.  She notes knee swells on/ off since having arthroscopy. She denies any new strain or injury. She notes Tylenol helps with the pain. She notes exercise sometimes helps, but occasional makes pain worse. She denies any redness around the knee or skin changes.          Current Outpatient Prescriptions on File Prior to Visit  Medication Sig Dispense Refill  . Ascorbic Acid (VITAMIN C) 100 MG tablet Take 100 mg by mouth daily.      . Calcium Carbonate (CALCIUM 600 PO) Take by mouth.      . cholecalciferol (VITAMIN D) 1000 UNITS tablet Take 1,000 Units by mouth daily.      . enalapril (VASOTEC) 20 MG tablet Take 20 mg by mouth daily.      Marland Kitchen levothyroxine (SYNTHROID, LEVOTHROID) 50 MCG tablet Take 25 mcg by mouth daily before breakfast.      . LORazepam (ATIVAN) 1 MG tablet TAKE ONE-HALF TO ONE TABLET BY MOUTH THREE TIMES DAILY AS NEEDED  90 tablet  0  . sertraline (ZOLOFT) 50 MG tablet Take 50 mg by mouth daily. 1/2 daily       No current facility-administered medications on file prior to visit.   ALLERGIES Alprazolam; Citalopram; Prednisone; and Sulfa antibiotics  Past Medical History  Diagnosis Date  . Hyperlipidemia   . Hypertension   . Pre-diabetes   . DJD (degenerative joint disease)   . Hypothyroid     Review of Systems  Constitutional: Positive for fatigue.  Musculoskeletal: Positive for arthralgias and joint swelling.  All other systems reviewed and are negative.    BP 132/78  Pulse 62  Temp(Src) 98.2 F (36.8 C) (Temporal)  Resp  18  Wt 125 lb (56.7 kg)     Objective:   Physical Exam  Nursing note and vitals reviewed. Constitutional: She is oriented to person, place, and time. She appears well-developed and well-nourished. No distress.  HENT:  Head: Normocephalic and atraumatic.  Right Ear: External ear normal.  Left Ear: External ear normal.  Nose: Nose normal.  Mouth/Throat: Oropharynx is clear and moist.  Eyes: Conjunctivae and EOM are normal.  Neck: Normal range of motion. Neck supple. No JVD present. No thyromegaly present.  Cardiovascular: Normal rate, regular rhythm, normal heart sounds and intact distal pulses.   Pulmonary/Chest: Effort normal and breath sounds normal.  Abdominal: Soft. Bowel sounds are normal. She exhibits no distension and no mass. There is no tenderness. There is no rebound and no guarding.  Musculoskeletal: Normal range of motion. She exhibits edema and tenderness.  Right medial knee mild, Good ROM no obvious weakness. + Crepitus. Mild gait disturbance   Lymphadenopathy:    She has no cervical adenopathy.  Neurological: She is alert and oriented to person, place, and time. No cranial nerve deficit.  Skin: Skin is warm and dry. No rash noted. No erythema. No pallor.  Psychiatric: She has a normal mood and affect. Her behavior is normal. Judgment and thought content normal.  Assessment & Plan:  1.  3 month F/U for HTN, Cholesterol, Pre-Dm, D. Deficient. Needs healthy diet, cardio QD and obtain healthy weight. Check Labs, Check BP if >130/80 call office 2. Right knee pain/ swelling- R.I.C.E. Chair exercises, if no change needs ortho re-eval, check labs 3. Hypothyroid- recheck labs

## 2013-08-07 ENCOUNTER — Other Ambulatory Visit: Payer: Self-pay | Admitting: Internal Medicine

## 2013-08-07 MED ORDER — CETIRIZINE HCL 10 MG PO TABS: 10.0000 mg | ORAL_TABLET | Freq: Every day | ORAL | Status: AC

## 2013-08-07 MED ORDER — ACETAMINOPHEN-CODEINE #3 300-30 MG PO TABS: ORAL_TABLET | ORAL | Status: AC

## 2013-09-01 ENCOUNTER — Other Ambulatory Visit: Payer: Self-pay | Admitting: Internal Medicine

## 2013-09-01 MED ORDER — LORAZEPAM 1 MG PO TABS: ORAL_TABLET | ORAL | Status: DC

## 2013-11-01 ENCOUNTER — Ambulatory Visit (INDEPENDENT_AMBULATORY_CARE_PROVIDER_SITE_OTHER): Payer: Medicare Other | Admitting: Internal Medicine

## 2013-11-01 ENCOUNTER — Encounter: Payer: Self-pay | Admitting: Internal Medicine

## 2013-11-01 VITALS — BP 126/82 | HR 56 | Temp 98.2°F | Resp 16 | Ht 65.0 in | Wt 124.8 lb

## 2013-11-01 DIAGNOSIS — Z1212 Encounter for screening for malignant neoplasm of rectum: Secondary | ICD-10-CM

## 2013-11-01 DIAGNOSIS — H179 Unspecified corneal scar and opacity: Secondary | ICD-10-CM

## 2013-11-01 DIAGNOSIS — H338 Other retinal detachments: Secondary | ICD-10-CM

## 2013-11-01 DIAGNOSIS — E559 Vitamin D deficiency, unspecified: Secondary | ICD-10-CM

## 2013-11-01 DIAGNOSIS — R7303 Prediabetes: Secondary | ICD-10-CM

## 2013-11-01 DIAGNOSIS — E785 Hyperlipidemia, unspecified: Secondary | ICD-10-CM

## 2013-11-01 DIAGNOSIS — Z1331 Encounter for screening for depression: Secondary | ICD-10-CM

## 2013-11-01 DIAGNOSIS — I10 Essential (primary) hypertension: Secondary | ICD-10-CM

## 2013-11-01 DIAGNOSIS — Z79899 Other long term (current) drug therapy: Secondary | ICD-10-CM | POA: Insufficient documentation

## 2013-11-01 DIAGNOSIS — Z789 Other specified health status: Secondary | ICD-10-CM

## 2013-11-01 LAB — CBC WITH DIFFERENTIAL/PLATELET
BASOS ABS: 0 10*3/uL (ref 0.0–0.1)
Basophils Relative: 0 % (ref 0–1)
EOS PCT: 1 % (ref 0–5)
Eosinophils Absolute: 0.1 10*3/uL (ref 0.0–0.7)
HCT: 42.8 % (ref 36.0–46.0)
HEMOGLOBIN: 14.6 g/dL (ref 12.0–15.0)
LYMPHS ABS: 2.1 10*3/uL (ref 0.7–4.0)
Lymphocytes Relative: 30 % (ref 12–46)
MCH: 31.9 pg (ref 26.0–34.0)
MCHC: 34.1 g/dL (ref 30.0–36.0)
MCV: 93.7 fL (ref 78.0–100.0)
MONO ABS: 0.4 10*3/uL (ref 0.1–1.0)
MONOS PCT: 6 % (ref 3–12)
Neutro Abs: 4.4 10*3/uL (ref 1.7–7.7)
Neutrophils Relative %: 63 % (ref 43–77)
Platelets: 301 10*3/uL (ref 150–400)
RBC: 4.57 MIL/uL (ref 3.87–5.11)
RDW: 15.1 % (ref 11.5–15.5)
WBC: 7 10*3/uL (ref 4.0–10.5)

## 2013-11-01 LAB — BASIC METABOLIC PANEL WITH GFR
BUN: 10 mg/dL (ref 6–23)
CHLORIDE: 96 meq/L (ref 96–112)
CO2: 27 meq/L (ref 19–32)
CREATININE: 0.6 mg/dL (ref 0.50–1.10)
Calcium: 9.8 mg/dL (ref 8.4–10.5)
GFR, Est African American: >89 mL/min
GFR, Est Non African American: 87 mL/min
GLUCOSE: 81 mg/dL (ref 70–99)
POTASSIUM: 3.6 meq/L (ref 3.5–5.3)
Sodium: 133 mEq/L — ABNORMAL LOW (ref 135–145)

## 2013-11-01 LAB — HEPATIC FUNCTION PANEL
ALBUMIN: 4.9 g/dL (ref 3.5–5.2)
ALK PHOS: 60 U/L (ref 39–117)
ALT: 18 U/L (ref 0–35)
AST: 22 U/L (ref 0–37)
BILIRUBIN INDIRECT: 0.4 mg/dL (ref 0.2–1.2)
Bilirubin, Direct: 0.1 mg/dL (ref 0.0–0.3)
Total Bilirubin: 0.5 mg/dL (ref 0.2–1.2)
Total Protein: 7.5 g/dL (ref 6.0–8.3)

## 2013-11-01 LAB — LIPID PANEL
Cholesterol: 201 mg/dL — ABNORMAL HIGH (ref 0–200)
HDL: 68 mg/dL (ref >39)
LDL CALC: 110 mg/dL — AB (ref 0–99)
TRIGLYCERIDES: 116 mg/dL (ref <150)
Total CHOL/HDL Ratio: 3 Ratio
VLDL: 23 mg/dL (ref 0–40)

## 2013-11-01 LAB — TSH: TSH: 2.244 u[IU]/mL (ref 0.350–4.500)

## 2013-11-01 LAB — HEMOGLOBIN A1C
Hgb A1c MFr Bld: 5.6 % (ref <5.7)
Mean Plasma Glucose: 114 mg/dL (ref <117)

## 2013-11-01 LAB — MAGNESIUM: Magnesium: 2 mg/dL (ref 1.5–2.5)

## 2013-11-01 NOTE — Patient Instructions (Signed)

## 2013-11-01 NOTE — Progress Notes (Signed)
Patient ID: Stephanie Weeks, female   DOB: 1933-08-08, 80 y.o.   MRN: [id number]   Annual  Comprehensive Examination  This very nice 80 y.o. MWF presents for complete physical.  Patient has been followed for HTN, Prediabetes, Hyperlipidemia, and Vitamin D Deficiency.    HTN predates since 21. Patient's BP has been controlled at home. Today's BP: 126/82 mmHg. Patient denies any cardiac symptoms as chest pain, palpitations, shortness of breath, dizziness or ankle swelling.   Patient's hyperlipidemia is controlled with diet. Patient denies myalgias or other medication SE's. Last cholesterol last visit was 176, triglycerides 104, HDL 61 and LDL 94 in Dec 2014 - all at goal.     Patient has prediabetes with A1c 5.9% in Mar 2012 and last A1c was 5.8% in Sept 2014. Patient denies reactive hypoglycemic symptoms, visual blurring, diabetic polys, or paresthesias.    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   Finally, patient has history of Vitamin D Deficiency with last vitamin D   Medication Sig  . acetaminophen-codeine (TYLENOL #3) 300-30 MG per tablet 1 tablet 4 x daily or every 4 hours as need for pain  . Ascorbic Acid (VITAMIN C) 100 MG tablet Take 500 mg by mouth daily. Takes 1000 mg daily  . Calcium Carbonate (CALCIUM 600 PO) Take by mouth.  . cetirizine (ZYRTEC) 10 MG tablet Take 1 tablet (10 mg total) by mouth daily. For allergies  . cholecalciferol (VITAMIN D) 1000 UNITS tablet Take 1,000 Units by mouth daily.  . enalapril (VASOTEC) 20 MG tablet Take 20 mg by mouth daily.  Marland Kitchen levothyroxine (SYNTHROID, LEVOTHROID) 50 MCG tablet Take 25 mcg by mouth daily before breakfast.  . LORazepam (ATIVAN) 1 MG tablet 1/2 to 1 tablet up to 3 x daily as needed for anxiety  . sertraline (ZOLOFT) 50 MG tablet Take 50 mg by mouth daily. 1/2 daily   Allergies   Allergen Reactions  . Alprazolam Other (See Comments)    constipation  . Citalopram Itching  . Prednisone   . Sulfa Antibiotics Rash    Past Medical History  Diagnosis Date  . Hyperlipidemia   . Hypertension   . Pre-diabetes   . DJD (degenerative joint disease)   . Hypothyroid     Past Surgical History  Procedure Laterality Date  . Total abdominal hysterectomy  1985  . Corneal transplant Left 1985  . Flexible sigmoidoscopy  [id number]  . Myringotomy Left 1990  . Corneal transplant Right 1991  . Knee arthroscopy Right 1995  . Enterocele repair  2001    posterior  . Retinal detachment surgery Right 2001  . Cardiac catheterization      negative  . Lipoma excision Left 2004    parascapular lipoma. benign.    Family History  Problem Relation Age of Onset  . Hypertension Mother   . Stroke Mother   . Heart disease Father   . Heart attack Father   . Heart disease Brother   . Hypertension Brother   . Hypertension Brother   . Hypertension Sister   . Hypertension Sister   . Cancer Sister     ovary  . Hypertension Daughter     History  Substance Use Topics  . Smoking status: Never Smoker   . Smokeless tobacco: Not on file  . Alcohol Use: No    ROS Constitutional: Denies fever, chills, weight loss/gain, headaches,  insomnia, fatigue, night sweats, and change in appetite. Eyes: Denies redness, blurred vision, diplopia, discharge, itchy, watery eyes.  ENT: Denies discharge, congestion, post nasal drip, epistaxis, sore throat, earache, hearing loss, dental pain, Tinnitus, Vertigo, Sinus pain, snoring.  Cardio: Denies chest pain, palpitations, irregular heartbeat, syncope, dyspnea, diaphoresis, orthopnea, PND, claudication, edema Respiratory: denies cough, dyspnea, DOE, pleurisy, hoarseness, laryngitis, wheezing.  Gastrointestinal: Denies dysphagia, heartburn, reflux, water brash, pain, cramps, nausea, vomiting, bloating, diarrhea, constipation, hematemesis, melena,  hematochezia, jaundice, hemorrhoids Genitourinary: Denies dysuria, frequency, urgency, nocturia, hesitancy, discharge, hematuria, flank pain Breast:Breast lumps, nipple discharge, bleeding.  Musculoskeletal: Denies arthralgia, myalgia, stiffness, Jt. Swelling, pain, limp, and strain/sprain. Skin: Denies puritis, rash, hives, warts, acne, eczema, changing in skin lesion Neuro: No weakness, tremor, incoordination, spasms, paresthesia, pain Psychiatric: Denies confusion, memory loss, sensory loss Endocrine: Denies change in weight, skin, hair change, nocturia, and paresthesia, diabetic polys, visual blurring, hyper / hypo glycemic episodes.  Heme/Lymph: No excessive bleeding, bruising, enlarged lymph nodes.  Physical Exam  BP 126/82  Pulse 56  Temp(Src) 98.2 F (36.8 C) (Temporal)  Resp 16  Ht 5\' 5"  (1.651 m)  Wt 124 lb 12.8 oz (56.609 kg)  BMI 20.77 kg/m2  General Appearance: Well nourished, in no apparent distress. Eyes: Rt pupil obscured by dense corneal scar., EOMs, conjunctiva no swelling or erythema, normal fundi and vessels. Sinuses: No frontal/maxillary tenderness ENT/Mouth: EACs patent / TMs  nl. Nares clear without erythema, swelling, mucoid exudates. Oral hygiene is good. No erythema, swelling, or exudate. Tongue normal, non-obstructing. Tonsils not swollen or erythematous. Hearing normal.  Neck: Supple, thyroid normal. No bruits, nodes or JVD. Respiratory: Respiratory effort normal.  BS equal and clear bilateral without rales, rhonci, wheezing or stridor. Cardio: Heart sounds are normal with regular rate and rhythm and no murmurs, rubs or gallops. Peripheral pulses are normal and equal bilaterally without edema. No aortic or femoral bruits. Chest: symmetric with normal excursions and percussion. Breasts: Symmetric, without lumps, nipple discharge, retractions, or fibrocystic changes.  Abdomen: Flat, soft, with bowl sounds. Nontender, no guarding, rebound, hernias, masses, or  organomegaly.  Lymphatics: Non tender without lymphadenopathy.  Musculoskeletal: Full ROM all peripheral extremities, joint stability, 5/5 strength, and normal gait. Skin: Warm and dry without rashes, lesions, cyanosis, clubbing or  ecchymosis.  Neuro: Cranial nerves intact, reflexes equal bilaterally. Normal muscle tone, no cerebellar symptoms. Sensation intact.  Pysch: Awake and oriented X 3, normal affect, Insight and Judgment appropriate.   Assessment and Plan  1. Annual Comprehensive Examination 2. Hypertension  3. Hyperlipidemia 4. Pre Diabetes 5. Vitamin D Deficiency  Continue prudent diet as discussed, weight control, BP monitoring, regular exercise, and medications. Discussed med's effects and SE's. Screening labs and tests as requested with regular follow-up as recommended.

## 2013-11-02 LAB — MICROALBUMIN / CREATININE URINE RATIO
Creatinine, Urine: 70.9 mg/dL
MICROALB UR: 0.5 mg/dL (ref 0.00–1.89)
Microalb Creat Ratio: 7.1 mg/g (ref 0.0–30.0)

## 2013-11-02 LAB — VITAMIN D 25 HYDROXY (VIT D DEFICIENCY, FRACTURES): Vit D, 25-Hydroxy: 68 ng/mL (ref 30–89)

## 2013-11-02 LAB — URINALYSIS, MICROSCOPIC ONLY
BACTERIA UA: NONE SEEN
CRYSTALS: NONE SEEN
Casts: NONE SEEN
SQUAMOUS EPITHELIAL / LPF: NONE SEEN

## 2013-11-02 LAB — INSULIN, FASTING: Insulin fasting, serum: 4 u[IU]/mL (ref 3–28)

## 2013-11-06 ENCOUNTER — Other Ambulatory Visit: Payer: Self-pay | Admitting: Internal Medicine

## 2013-11-06 MED ORDER — SERTRALINE HCL 50 MG PO TABS: ORAL_TABLET | ORAL | Status: DC

## 2013-11-21 ENCOUNTER — Other Ambulatory Visit (INDEPENDENT_AMBULATORY_CARE_PROVIDER_SITE_OTHER): Payer: Medicare Other

## 2013-11-21 DIAGNOSIS — Z1212 Encounter for screening for malignant neoplasm of rectum: Secondary | ICD-10-CM

## 2013-11-21 LAB — POC HEMOCCULT BLD/STL (HOME/3-CARD/SCREEN)
Card #2 Fecal Occult Blod, POC: NEGATIVE
Card #3 Fecal Occult Blood, POC: NEGATIVE
FECAL OCCULT BLD: NEGATIVE

## 2013-12-07 ENCOUNTER — Ambulatory Visit (INDEPENDENT_AMBULATORY_CARE_PROVIDER_SITE_OTHER): Payer: Medicare Other | Admitting: Physician Assistant

## 2013-12-07 ENCOUNTER — Encounter: Payer: Self-pay | Admitting: Physician Assistant

## 2013-12-07 VITALS — BP 142/72 | HR 60 | Temp 98.2°F | Resp 16 | Ht 65.5 in | Wt 126.0 lb

## 2013-12-07 DIAGNOSIS — N3000 Acute cystitis without hematuria: Secondary | ICD-10-CM

## 2013-12-07 DIAGNOSIS — E039 Hypothyroidism, unspecified: Secondary | ICD-10-CM

## 2013-12-07 DIAGNOSIS — R7303 Prediabetes: Secondary | ICD-10-CM

## 2013-12-07 DIAGNOSIS — E559 Vitamin D deficiency, unspecified: Secondary | ICD-10-CM

## 2013-12-07 DIAGNOSIS — I10 Essential (primary) hypertension: Secondary | ICD-10-CM

## 2013-12-07 DIAGNOSIS — E2839 Other primary ovarian failure: Secondary | ICD-10-CM

## 2013-12-07 DIAGNOSIS — Z1331 Encounter for screening for depression: Secondary | ICD-10-CM

## 2013-12-07 DIAGNOSIS — E785 Hyperlipidemia, unspecified: Secondary | ICD-10-CM

## 2013-12-07 DIAGNOSIS — Z0000 Encounter for general adult medical examination without abnormal findings: Secondary | ICD-10-CM

## 2013-12-07 DIAGNOSIS — Z789 Other specified health status: Secondary | ICD-10-CM

## 2013-12-07 MED ORDER — CIPROFLOXACIN HCL 500 MG PO TABS: 500.0000 mg | ORAL_TABLET | Freq: Two times a day (BID) | ORAL | Status: AC

## 2013-12-07 NOTE — Progress Notes (Signed)
Subjective:   Stephanie Weeks is a 80 y.o. female who presents for Medicare Annual Wellness Visit and UTI symptoms for 1 week.  Date of last medicare wellness visit is unknown.   Her blood pressure has been controlled at home, today their BP is BP: 142/72 mmHg She does workout. She denies chest pain, shortness of breath, dizziness.   For the past week she has had burning, frequency, nocturia, some incontinence, urgency and suprapubic pain. She does complain of lower back pain and she has had some chills and low grade temp.   Names of Other Physician/Practitioners you currently use: 1. [facility]- [facility] for primary care [facility]  3. Dr. Annabell HowellsWrenn Patient Care Team: Lucky CowboyWilliam McKeown, MD as PCP - General (Internal Medicine)  Medication Review Current Outpatient Prescriptions on File Prior to Visit  Medication Sig Dispense Refill  . Ascorbic Acid (VITAMIN C) 100 MG tablet Take 500 mg by mouth daily. Takes 1000 mg daily      . Calcium Carbonate (CALCIUM 600 PO) Take by mouth.      . cetirizine (ZYRTEC) 10 MG tablet Take 1 tablet (10 mg total) by mouth daily. For allergies  90 tablet  99  . cholecalciferol (VITAMIN D) 1000 UNITS tablet Take 1,000 Units by mouth daily.      . enalapril (VASOTEC) 20 MG tablet Take 20 mg by mouth daily.      Marland Kitchen. levothyroxine (SYNTHROID, LEVOTHROID) 50 MCG tablet Take 25 mcg by mouth daily before breakfast.      . LORazepam (ATIVAN) 1 MG tablet 1/2 to 1 tablet up to 3 x daily as needed for anxiety  90 tablet  5  . sertraline (ZOLOFT) 50 MG tablet 1/2 to 1 tablet daily as directed for mood  30 tablet  99   No current facility-administered medications on file prior to visit.    Current Problems (verified) Patient Active Problem List   Diagnosis Date Noted  . Vitamin D Deficiency 11/01/2013  . Encounter for long-term (current) use of other medications 11/01/2013  . Rt. Corneal  scarring with Blindness 11/01/2013  . 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 11/01/2013  . Hyperlipidemia   . Hypertension   . Pre-diabetes   . DJD (degenerative joint disease)   . Hypothyroid     Screening Tests Health Maintenance  Topic Date Due  . Tetanus/tdap  08/[birth year]/1953  . Colonoscopy  08/26/1984  . Influenza Vaccine  03/10/2014  . Pneumococcal Polysaccharide Vaccine Age 78 And Over  Completed  . Zostavax  Completed     Immunization History  Administered Date(s) Administered  . DTaP 08/02/2007  . Influenza Whole 05/03/2013  . Pneumococcal Polysaccharide-23 10/28/2011  . Zoster 09/06/2008    Preventative care: Last colonoscopy: 05/2012 Dr. Matthias HughsBuccini Last mammogram: 04/2013 Last pap smear/pelvic exam: 2013 DEXA:2002 DUE Heart cath Negative 2004  Prior vaccinations: TD or Tdap: 2008  Influenza: 2014  Pneumococcal: 2013 Shingles/Zostavax: 2010  History reviewed: allergies, current medications, past family history, past medical history, past social history, past surgical history and problem list  Risk Factors: Osteoporosis: postmenopausal estrogen deficiency and dietary calcium and/or vitamin D deficiency History of fracture in the past year: no  Tobacco History  Substance Use Topics  . Smoking status: Never Smoker   . Smokeless tobacco: Not on file  . Alcohol Use: No   She does not smoke.  Patient is not a former smoker. Are there smokers in your home (other than you)?  No  Alcohol Current alcohol use: none  Caffeine Current caffeine use: coffee 1-2 /day  Exercise Exercise limitations: The patient has no exercise limitations. Current exercise: walking  Nutrition/Diet Current diet: in general, a "healthy" diet    Cardiac risk factors: advanced age (older than 58 for men, 35 for women), dyslipidemia, family history of premature cardiovascular disease, hypertension and sedentary lifestyle.  Depression Screen (Note: if answer to either of the following is  "Yes", a more complete depression screening is indicated)   Q1: Over the past two weeks, have you felt down, depressed or hopeless? No  Q2: Over the past two weeks, have you felt little interest or pleasure in doing things? No  Have you lost interest or pleasure in daily life? No  Do you often feel hopeless? No  Do you cry easily over simple problems? No  Activities of Daily Living In your present state of health, do you have any difficulty performing the following activities?:  Driving? No Managing money?  No Feeding yourself? No Getting from bed to chair? No Climbing a flight of stairs? No Preparing food and eating?: No Bathing or showering? No Getting dressed: No Getting to the toilet? No Using the toilet:No Moving around from place to place: No In the past year have you fallen or had a near fall?:No   Are you sexually active?  No  Do you have more than one partner?  No  Vision Difficulties: Yes  Hearing Difficulties: No Do you often ask people to speak up or repeat themselves? No Do you experience ringing or noises in your ears? No Do you have difficulty understanding soft or whispered voices? No  Cognition  Do you feel that you have a problem with memory?No  Do you often misplace items? No  Do you feel safe at home?  Yes  Advanced directives Does patient have a Health Care Power of Attorney? No Does patient have a Living Will? No   Objective:   Blood pressure 142/72, pulse 60, temperature 98.2 F (36.8 C), resp. rate 16, height 5' 5.5" (1.664 m), weight 126 lb (57.153 kg). Body mass index is 20.64 kg/(m^2).  General appearance: alert, no distress, WD/WN,  female Cognitive Testing  Alert? Yes  Normal Appearance?Yes  Oriented to person? Yes  Place? Yes   Time? Yes  Recall of three objects?   yes  Can perform simple calculations? Yes  Displays appropriate judgment?Yes  Can read the correct time from a watch face?Yes  HEENT: normocephalic, sclerae  anicteric, TMs pearly, nares patent, no discharge or erythema, pharynx normal Oral cavity: MMM, no lesions Neck: supple, no lymphadenopathy, no thyromegaly, no masses Heart: RRR, normal S1, S2, no murmurs Lungs: CTA bilaterally, no wheezes, rhonchi, or rales Abdomen: +bs, soft, mild suprapubic tenderness, non distended, no masses, no hepatomegaly, no splenomegaly Musculoskeletal: nontender, no swelling, no obvious deformity Extremities: no edema, no cyanosis, no clubbing Pulses: 2+ symmetric, upper and lower extremities, normal cap refill Neurological: alert, oriented x 3, CN2-12 intact, strength normal upper extremities and lower extremities, sensation normal throughout, DTRs 2+ throughout, no cerebellar signs, gait normal Psychiatric: normal affect, behavior normal, pleasant  Breast: defer Gyn: defer Rectal: defer   Assessment:     Plan:   During the course of the visit the patient was educated and counseled about appropriate screening and preventive services including:    Pneumococcal vaccine   Influenza vaccine  Td vaccine  Screening electrocardiogram  Screening mammography  Bone densitometry screening  Colorectal cancer screening  Diabetes screening  Glaucoma screening  Nutrition counseling   Advanced directives: given information  Screening recommendations, referrals:  Vaccinations: Tdap vaccine not indicated Influenza vaccine not indicated Pneumococcal vaccine not indicated Shingles vaccine not indicated Hep B vaccine not indicated  Nutrition assessed and recommended  Colonoscopy not indicated Mammogram not indicated Pap smear not indicated Pelvic exam not indicated Recommended yearly ophthalmology/optometry visit for glaucoma screening and checkup Recommended yearly dental visit for hygiene and checkup Advanced directives - requested  Conditions/risks identified: BMI: Discussed weight loss, diet, and increase physical activity.  Increase  physical activity: AHA recommends 150 minutes of physical activity a week.  Medications reviewed DEXA- ordered Diabetes is at goal, ACE/ARB therapy: Yes. Urinary Incontinence is not an issue: discussed non pharmacology and pharmacology options.  Fall risk: low- discussed PT, home fall assessment, medications.   Medicare Attestation I have personally reviewed: The patient's medical and social history Their use of alcohol, tobacco or illicit drugs Their current medications and supplements The patient's functional ability including ADLs,fall risks, home safety risks, cognitive, and hearing and visual impairment Diet and physical activities Evidence for depression or mood disorders  The patient's weight, height, BMI, and visual acuity have been recorded in the chart.  I have made referrals, counseling, and provided education to the patient based on review of the above and I have provided the patient with a written personalized care plan for preventive services.     Quentin Mullingmanda Romain Erion, PA-C   12/07/2013

## 2013-12-07 NOTE — Patient Instructions (Signed)
Preventative Care for Adults - Female      MAINTAIN REGULAR HEALTH EXAMS:  A routine yearly physical is a good way to check in with your primary care provider about your health and preventive screening. It is also an opportunity to share updates about your health and any concerns you have, and receive a thorough all-over exam.   Most health insurance companies pay for at least some preventative services.  Check with your health plan for specific coverages.  WHAT PREVENTATIVE SERVICES DO WOMEN NEED?  Adult women should have their weight and blood pressure checked regularly.   Women age 35 and older should have their cholesterol levels checked regularly.  Women should be screened for cervical cancer with a Pap smear and pelvic exam beginning at either age 21, or 3 years after they become sexually activity.    Breast cancer screening generally begins at age 40 with a mammogram and breast exam by your primary care provider.    Beginning at age 50 and continuing to age 75, women should be screened for colorectal cancer.  Certain people may need continued testing until age 85.  Updating vaccinations is part of preventative care.  Vaccinations help protect against diseases such as the flu.  Osteoporosis is a disease in which the bones lose minerals and strength as we age. Women ages 65 and over should discuss this with their caregivers, as should women after menopause who have other risk factors.  Lab tests are generally done as part of preventative care to screen for anemia and blood disorders, to screen for problems with the kidneys and liver, to screen for bladder problems, to check blood sugar, and to check your cholesterol level.  Preventative services generally include counseling about diet, exercise, avoiding tobacco, drugs, excessive alcohol consumption, and sexually transmitted infections.    GENERAL RECOMMENDATIONS FOR GOOD HEALTH:  Healthy diet:  Eat a variety of foods, including  fruit, vegetables, animal or vegetable protein, such as meat, fish, chicken, and eggs, or beans, lentils, tofu, and grains, such as rice.  Drink plenty of water daily.  Decrease saturated fat in the diet, avoid lots of red meat, processed foods, sweets, fast foods, and fried foods.  Exercise:  Aerobic exercise helps maintain good heart health. At least 30-40 minutes of moderate-intensity exercise is recommended. For example, a brisk walk that increases your heart rate and breathing. This should be done on most days of the week.   Find a type of exercise or a variety of exercises that you enjoy so that it becomes a part of your daily life.  Examples are running, walking, swimming, water aerobics, and biking.  For motivation and support, explore group exercise such as aerobic class, spin class, Zumba, Yoga,or  martial arts, etc.    Set exercise goals for yourself, such as a certain weight goal, walk or run in a race such as a 5k walk/run.  Speak to your primary care provider about exercise goals.  Disease prevention:  If you smoke or chew tobacco, find out from your caregiver how to quit. It can literally save your life, no matter how long you have been a tobacco user. If you do not use tobacco, never begin.   Maintain a healthy diet and normal weight. Increased weight leads to problems with blood pressure and diabetes.   The Body Mass Index or BMI is a way of measuring how much of your body is fat. Having a BMI above 27 increases the risk of heart disease,   diabetes, hypertension, stroke and other problems related to obesity. Your caregiver can help determine your BMI and based on it develop an exercise and dietary program to help you achieve or maintain this important measurement at a healthful level.  High blood pressure causes heart and blood vessel problems.  Persistent high blood pressure should be treated with medicine if weight loss and exercise do not work.   Fat and cholesterol leaves  deposits in your arteries that can block them. This causes heart disease and vessel disease elsewhere in your body.  If your cholesterol is found to be high, or if you have heart disease or certain other medical conditions, then you may need to have your cholesterol monitored frequently and be treated with medication.   Ask if you should have a cardiac stress test if your history suggests this. A stress test is a test done on a treadmill that looks for heart disease. This test can find disease prior to there being a problem.  Menopause can be associated with physical symptoms and risks. Hormone replacement therapy is available to decrease these. You should talk to your caregiver about whether starting or continuing to take hormones is right for you.   Osteoporosis is a disease in which the bones lose minerals and strength as we age. This can result in serious bone fractures. Risk of osteoporosis can be identified using a bone density scan. Women ages 65 and over should discuss this with their caregivers, as should women after menopause who have other risk factors. Ask your caregiver whether you should be taking a calcium supplement and Vitamin D, to reduce the rate of osteoporosis.   Avoid drinking alcohol in excess (more than two drinks per day).  Avoid use of street drugs. Do not share needles with anyone. Ask for professional help if you need assistance or instructions on stopping the use of alcohol, cigarettes, and/or drugs.  Brush your teeth twice a day with fluoride toothpaste, and floss once a day. Good oral hygiene prevents tooth decay and gum disease. The problems can be painful, unattractive, and can cause other health problems. Visit your dentist for a routine oral and dental check up and preventive care every 6-12 months.   Look at your skin regularly.  Use a mirror to look at your back. Notify your caregivers of changes in moles, especially if there are changes in shapes, colors, a size  larger than a pencil eraser, an irregular border, or development of new moles.  Safety:  Use seatbelts 100% of the time, whether driving or as a passenger.  Use safety devices such as hearing protection if you work in environments with loud noise or significant background noise.  Use safety glasses when doing any work that could send debris in to the eyes.  Use a helmet if you ride a bike or motorcycle.  Use appropriate safety gear for contact sports.  Talk to your caregiver about gun safety.  Use sunscreen with a SPF (or skin protection factor) of 15 or greater.  Lighter skinned people are at a greater risk of skin cancer. Don't forget to also wear sunglasses in order to protect your eyes from too much damaging sunlight. Damaging sunlight can accelerate cataract formation.   Practice safe sex. Use condoms. Condoms are used for birth control and to help reduce the spread of sexually transmitted infections (or STIs).  Some of the STIs are gonorrhea (the clap), chlamydia, syphilis, trichomonas, herpes, HPV (human papilloma virus) and HIV (human immunodeficiency virus)   which causes AIDS. The herpes, HIV and HPV are viral illnesses that have no cure. These can result in disability, cancer and death.   Keep carbon monoxide and smoke detectors in your home functioning at all times. Change the batteries every 6 months or use a model that plugs into the wall.   Vaccinations:  Stay up to date with your tetanus shots and other required immunizations. You should have a booster for tetanus every 10 years. Be sure to get your flu shot every year, since 5%-20% of the [place] population comes down with the flu. The flu vaccine changes each year, so being vaccinated once is not enough. Get your shot in the fall, before the flu season peaks.   Other vaccines to consider:  Human Papilloma Virus or HPV causes cancer of the cervix, and other infections that can be transmitted from person to person. There is a vaccine for  HPV, and females should get immunized between the ages of 11 and 26. It requires a series of 3 shots.   Pneumococcal vaccine to protect against certain types of pneumonia.  This is normally recommended for adults age 65 or older.  However, adults younger than 78 years old with certain underlying conditions such as diabetes, heart or lung disease should also receive the vaccine.  Shingles vaccine to protect against Varicella Zoster if you are older than age 60, or younger than 78 years old with certain underlying illness.  Hepatitis A vaccine to protect against a form of infection of the liver by a virus acquired from food.  Hepatitis B vaccine to protect against a form of infection of the liver by a virus acquired from blood or body fluids, particularly if you work in health care.  If you plan to travel internationally, check with your local health department for specific vaccination recommendations.  Cancer Screening:  Breast cancer screening is essential to preventive care for women. All women age 20 and older should perform a breast self-exam every month. At age 40 and older, women should have their caregiver complete a breast exam each year. Women at ages 40 and older should have a mammogram (x-ray film) of the breasts. Your caregiver can discuss how often you need mammograms.    Cervical cancer screening includes taking a Pap smear (sample of cells examined under a microscope) from the cervix (end of the uterus). It also includes testing for HPV (Human Papilloma Virus, which can cause cervical cancer). Screening and a pelvic exam should begin at age 21, or 3 years after a woman becomes sexually active. Screening should occur every year, with a Pap smear but no HPV testing, up to age 30. After age 30, you should have a Pap smear every 3 years with HPV testing, if no HPV was found previously.   Most routine colon cancer screening begins at the age of 50. On a yearly basis, doctors may provide  special easy to use take-home tests to check for hidden blood in the stool. Sigmoidoscopy or colonoscopy can detect the earliest forms of colon cancer and is life saving. These tests use a small camera at the end of a tube to directly examine the colon. Speak to your caregiver about this at age 50, when routine screening begins (and is repeated every 5 years unless early forms of pre-cancerous polyps or small growths are found).    Urinary Tract Infection A urinary tract infection (UTI) can occur any place along the urinary tract. The tract includes the kidneys, ureters,   bladder, and urethra. A type of germ called bacteria often causes a UTI. UTIs are often helped with antibiotic medicine.  HOME CARE   If given, take antibiotics as told by your doctor. Finish them even if you start to feel better.  Drink enough fluids to keep your pee (urine) clear or pale yellow.  Avoid tea, drinks with caffeine, and bubbly (carbonated) drinks.  Pee often. Avoid holding your pee in for a long time.  Pee before and after having sex (intercourse).  Wipe from front to back after you poop (bowel movement) if you are a woman. Use each tissue only once. GET HELP RIGHT AWAY IF:   You have back pain.  You have lower belly (abdominal) pain.  You have chills.  You feel sick to your stomach (nauseous).  You throw up (vomit).  Your burning or discomfort with peeing does not go away.  You have a fever.  Your symptoms are not better in 3 days. MAKE SURE YOU:   Understand these instructions.  Will watch your condition.  Will get help right away if you are not doing well or get worse. Document Released: 01/13/2008 Document Revised: 04/20/2012 Document Reviewed: 02/25/2012 [employer] Patient Information 2014 [employer].  

## 2013-12-08 LAB — URINALYSIS, ROUTINE W REFLEX MICROSCOPIC
Bilirubin Urine: NEGATIVE
Glucose, UA: NEGATIVE mg/dL
Hgb urine dipstick: NEGATIVE
KETONES UR: NEGATIVE mg/dL
NITRITE: NEGATIVE
PH: 6.5 (ref 5.0–8.0)
Protein, ur: NEGATIVE mg/dL
SPECIFIC GRAVITY, URINE: 1.013 (ref 1.005–1.030)
UROBILINOGEN UA: 0.2 mg/dL (ref 0.0–1.0)

## 2013-12-08 LAB — URINALYSIS, MICROSCOPIC ONLY
Bacteria, UA: NONE SEEN
Casts: NONE SEEN
Crystals: NONE SEEN
Squamous Epithelial / LPF: NONE SEEN
WBC, UA: >50 WBC/hpf — AB (ref <3)

## 2013-12-09 LAB — URINE CULTURE

## 2013-12-10 MED ORDER — AMOXICILLIN-POT CLAVULANATE 875-125 MG PO TABS: 1.0000 | ORAL_TABLET | Freq: Two times a day (BID) | ORAL | Status: AC

## 2013-12-10 NOTE — Addendum Note (Signed)
Addended by: Quentin MullingOLLIER, Burle Kwan R on: 12/10/2013 11:31 AM   Modules accepted: Orders

## 2013-12-19 ENCOUNTER — Telehealth: Payer: Self-pay | Admitting: *Deleted

## 2013-12-19 MED ORDER — LEVOTHYROXINE SODIUM 50 MCG PO TABS: 50.0000 ug | ORAL_TABLET | Freq: Every day | ORAL | Status: DC

## 2013-12-19 NOTE — Telephone Encounter (Signed)
REFILL = LEVOTHYROXINE 1QD [facility] [address] =[facility] PH[facility]

## 2014-02-01 ENCOUNTER — Ambulatory Visit (INDEPENDENT_AMBULATORY_CARE_PROVIDER_SITE_OTHER): Payer: Medicare Other | Admitting: Physician Assistant

## 2014-02-01 ENCOUNTER — Encounter: Payer: Self-pay | Admitting: Physician Assistant

## 2014-02-01 VITALS — BP 128/78 | HR 60 | Temp 98.1°F | Resp 16 | Ht 65.5 in | Wt 124.0 lb

## 2014-02-01 DIAGNOSIS — E785 Hyperlipidemia, unspecified: Secondary | ICD-10-CM

## 2014-02-01 DIAGNOSIS — I10 Essential (primary) hypertension: Secondary | ICD-10-CM

## 2014-02-01 DIAGNOSIS — R7309 Other abnormal glucose: Secondary | ICD-10-CM

## 2014-02-01 DIAGNOSIS — E039 Hypothyroidism, unspecified: Secondary | ICD-10-CM

## 2014-02-01 DIAGNOSIS — N3000 Acute cystitis without hematuria: Secondary | ICD-10-CM

## 2014-02-01 DIAGNOSIS — Z79899 Other long term (current) drug therapy: Secondary | ICD-10-CM

## 2014-02-01 DIAGNOSIS — E559 Vitamin D deficiency, unspecified: Secondary | ICD-10-CM

## 2014-02-01 DIAGNOSIS — R7303 Prediabetes: Secondary | ICD-10-CM

## 2014-02-01 LAB — CBC WITH DIFFERENTIAL/PLATELET
BASOS ABS: 0 10*3/uL (ref 0.0–0.1)
Basophils Relative: 0 % (ref 0–1)
Eosinophils Absolute: 0.2 10*3/uL (ref 0.0–0.7)
Eosinophils Relative: 3 % (ref 0–5)
HEMATOCRIT: 42.7 % (ref 36.0–46.0)
Hemoglobin: 14.4 g/dL (ref 12.0–15.0)
LYMPHS ABS: 2.7 10*3/uL (ref 0.7–4.0)
LYMPHS PCT: 39 % (ref 12–46)
MCH: 31.5 pg (ref 26.0–34.0)
MCHC: 33.7 g/dL (ref 30.0–36.0)
MCV: 93.4 fL (ref 78.0–100.0)
Monocytes Absolute: 0.5 10*3/uL (ref 0.1–1.0)
Monocytes Relative: 7 % (ref 3–12)
Neutro Abs: 3.6 10*3/uL (ref 1.7–7.7)
Neutrophils Relative %: 51 % (ref 43–77)
PLATELETS: 299 10*3/uL (ref 150–400)
RBC: 4.57 MIL/uL (ref 3.87–5.11)
RDW: 14.2 % (ref 11.5–15.5)
WBC: 7 10*3/uL (ref 4.0–10.5)

## 2014-02-01 LAB — HEPATIC FUNCTION PANEL
ALBUMIN: 4.7 g/dL (ref 3.5–5.2)
ALT: 15 U/L (ref 0–35)
AST: 22 U/L (ref 0–37)
Alkaline Phosphatase: 56 U/L (ref 39–117)
BILIRUBIN DIRECT: 0.1 mg/dL (ref 0.0–0.3)
BILIRUBIN TOTAL: 0.5 mg/dL (ref 0.2–1.2)
Indirect Bilirubin: 0.4 mg/dL (ref 0.2–1.2)
Total Protein: 7.3 g/dL (ref 6.0–8.3)

## 2014-02-01 LAB — MAGNESIUM: MAGNESIUM: 2.2 mg/dL (ref 1.5–2.5)

## 2014-02-01 LAB — HEMOGLOBIN A1C
HEMOGLOBIN A1C: 5.7 % — AB (ref <5.7)
Mean Plasma Glucose: 117 mg/dL — ABNORMAL HIGH (ref <117)

## 2014-02-01 LAB — LIPID PANEL
CHOL/HDL RATIO: 3 ratio
CHOLESTEROL: 198 mg/dL (ref 0–200)
HDL: 65 mg/dL (ref >39)
LDL Cholesterol: 114 mg/dL — ABNORMAL HIGH (ref 0–99)
TRIGLYCERIDES: 97 mg/dL (ref <150)
VLDL: 19 mg/dL (ref 0–40)

## 2014-02-01 LAB — BASIC METABOLIC PANEL WITH GFR
BUN: 10 mg/dL (ref 6–23)
CALCIUM: 9.7 mg/dL (ref 8.4–10.5)
CO2: 27 meq/L (ref 19–32)
Chloride: 99 mEq/L (ref 96–112)
Creat: 0.62 mg/dL (ref 0.50–1.10)
GFR, EST NON AFRICAN AMERICAN: 86 mL/min
GFR, Est African American: >89 mL/min
Glucose, Bld: 80 mg/dL (ref 70–99)
Potassium: 3.9 mEq/L (ref 3.5–5.3)
Sodium: 135 mEq/L (ref 135–145)

## 2014-02-01 LAB — TSH: TSH: 1.96 u[IU]/mL (ref 0.350–4.500)

## 2014-02-01 NOTE — Patient Instructions (Signed)
Add Boost/Ensure Try GREEK yogurt, cottage cheese, peanut butter, protein.   Malnutrition Many of us think of malnutrition as a condition in which there is not enough to eat. Malnutrition is actually any condition where nutrition is poor. This means:  Too much to eat as we see in conditions of obesity.  Too little to eat with starvation. The following information is only for the malnourished with dietary deficiencies (poor diet). CAUSES  Under-nutrition can result from:  Poor intake.  Malabsorption  Lactation  Bleeding  Diarrhea  Old age.  Kidney failure.  Infancy  Poverty  Infection  Adolescence  Excessive sweating  Drug addiction  Pregnancy  Early childhood. Under-nutrition comes anytime the demand is more than the intake. SYMPTOMS  The problems depend on what type of malnutrition is present. Some general symptoms include:  Fatigue.  Dizziness.  Fainting  Weight loss.  Poor immune response.  Lack of menstruation.  Lack of growth in children.  Hair loss. DIAGNOSIS  Your caregiver will usually suspect malnutrition based on results of your:   Medical and dietary history.  Physical exam. This will often include measurements of your BMI (body mass index).  Perhaps some blood tests. These may include: plasma levels of nutrients and nutrient-dependent substances, such as:  Hemoglobin  Thyroid hormones  Transferrin  Albumin RISK FACTORS Persons in the following circumstances may be at risk of malnutrition.  Infants and children are at risk of under-nutrition. This is because of their high demand for energy and essential nutrients. Protein-energy malnutrition in children consuming inadequate amounts of protein, calories, and other nutrients is a particularly severe form of under-nutrition that delays growth and development. This includes [place] and [place].  Hemorrhagic disease of the newborn is a life-threatening disorder. This is due  to a lack of vitamin K, iron, folic acid, vitamin C, copper, zinc, and vitamin A. This may occur in inadequately fed infants and children.  In adolescence, nutritional requirements increase because they are growing. Anorexia nervosa, a form of starvation, may affect adolescents.  Pregnancy and lactation. Requirements for all nutrients are increased during pregnancy and lactation.  Abnormal diets, such as pica (the consumption of nonnutritive substances, such as clay and charcoal), are common in pregnancy.  Anemia due to folic acid deficiency is common in pregnant women. This is especially true for those who have taken oral contraceptives. Folic acid supplements are now recommended for pregnant women. Folic acid prevents neural tube defects (spina bifida) in children.  Breast-fed-only infants may develop vitamin B12 deficiency if the mother is a vegan.  An alcoholic mother may have a handicapped and stunted child with fetal alcohol syndrome. This is due to the effects of alcohol on the fetus. Do not drink during pregnancy.  Old age: A weakened sense of taste and smell, loneliness, physical and mental handicaps, immobility, and chronic illness can hurt the food intake in the elderly. Absorption is reduced. This may add to iron deficiency, calcium and bone problems and also a softening of the bones due to lack of vitamin D. This is also made worse by not being in the sun.  With aging, we loose lean body mass. These changes and a reduction in physical activity result in lower energy and protein requirements compared with those of younger adults.  Chronic disease including malabsorption states (including those resulting from surgery) tend to impair the absorption of fat-soluble vitamins, vitamin B12, calcium, and iron.  Liver disease impairs the storage of vitamins A and B12. It also interferes  with the metabolism of protein and energy sources.  Kidney disease may cause deficiencies of protein,  iron, and vitamin D.  Cancer and AIDS may cause anorexia. This is a loss of appetite.  Vegetarian diet. The most common form of this type of diet is when meat and fish are not eaten, but eggs and dairy products are eaten. Iron deficiency is the only risk. Ovo-lacto vegetarians tend to live longer and to develop fewer chronic disabling conditions than their meat-eating peers. However, their lifestyle usually includes regular exercise and abstention from alcohol and tobacco. This may contribute to better health. Vegans consume no animal products and are susceptible to vitamin B12 deficiency. Yeast extracts and oriental-style fermented foods provide this vitamin. Intake of calcium, iron, and zinc also tends to be low. A fruitarian diet (eat only fruit) is deficient in protein, salt, and many micronutrients. This is not recommended.  Fad diets: Many commercial diets are claimed to enhance well-being or reduce weight. A physician should be alert to early evidence of nutrient deficiency or toxicity in patients on these diets. Such diets have resulted in vitamin, mineral, and protein deficiency states and cardiac, renal, and metabolic disorders. Some fad diets have resulted in death. People on very low calorie diets (less than 400 kcal/day) cannot sustain health for long. Some trace mineral supplements have induced toxicity.  Alcohol or drug dependency: Addiction leads to a troubled lifestyle in which adequate nourishment is ignored. Absorption and metabolism of nutrients are impaired. High levels of alcohol are poisonous. Too much alcohol can cause tissue injury, particularly of the GI tract, liver, pancreas, brain, and peripheral nervous system. Beer drinkers who consume food may gain weight, but alcoholics who use more than one quart of hard liquor per day lose weight and become undernourished. Drug addicts are usually very skinny. Alcoholism is the most common cause of thiamine deficiency and may lead to  deficiencies of magnesium, zinc, and other vitamins. TREATMENT  Get treatment if you experience changes in how your body is working.  PREVENTION  Eating a good, well-balanced diet helps to prevent most forms of malnutrition. Document Released: 06/12/2005 Document Revised: 10/19/2011 Document Reviewed: 07/04/2007 [facility] Patient Information 2015 [place], [place]. This information is not intended to replace advice given to you by your health care provider. Make sure you discuss any questions you have with your health care provider.

## 2014-02-01 NOTE — Progress Notes (Signed)
Assessment and Plan:  Hypertension: Continue medication, monitor blood pressure at home.  Continue DASH diet. Cholesterol: Continue diet and exercise. Check cholesterol.  Pre-diabetes-Continue diet and exercise. Check A1C Vitamin D Def- check level and continue medications.  Hypothyroidism-check TSH level, continue medications the same.  Recurrent UTI- treated with Augmentin, recheck urine Malnutrition- add boost/ensure  Continue diet and meds as discussed. Further disposition pending results of labs.  HPI 80 y.o. female  presents for 3 month follow up with hypertension, hyperlipidemia, prediabetes and vitamin D. Her blood pressure has been controlled at home, today their BP is BP: 128/78 mmHg She does not workout. She denies chest pain, shortness of breath, dizziness.  She is not on cholesterol medication and denies myalgias. Her cholesterol is at goal. The cholesterol last visit was:   Lab Results  Component Value Date   CHOL 201* 11/01/2013   HDL 68 11/01/2013   LDLCALC 110* 11/01/2013   TRIG 116 11/01/2013   CHOLHDL 3.0 11/01/2013   She has been working on diet and exercise for prediabetes, and denies polyuria, visual disturbances and vomiting. Last A1C in the office was:  Lab Results  Component Value Date   HGBA1C 5.6 11/01/2013   Patient is on Vitamin D supplement.   She is on thyroid medication. Her medication was not changed last visit. Patient denies nervousness, palpitations and weight changes.  Lab Results  Component Value Date   TSH 2.244 11/01/2013  .  Urinalysis    Component Value Date/Time   COLORURINE YELLOW 12/07/2013 1028   APPEARANCEUR CLEAR 12/07/2013 1028   LABSPEC 1.013 12/07/2013 1028   PHURINE 6.5 12/07/2013 1028   GLUCOSEU NEG 12/07/2013 1028   HGBUR NEG 12/07/2013 1028   BILIRUBINUR NEG 12/07/2013 1028   KETONESUR NEG 12/07/2013 1028   PROTEINUR NEG 12/07/2013 1028   UROBILINOGEN 0.2 12/07/2013 1028   NITRITE NEG 12/07/2013 1028   LEUKOCYTESUR LARGE*  12/07/2013 1028   She was given Augmentin for UTI in April and she states that she continues to feel pressure and has felt some fatigue over the past month.  She is no longer on the zoloft due to feeling bad.   Current Medications:  Current Outpatient Prescriptions on File Prior to Visit  Medication Sig Dispense Refill  . Ascorbic Acid (VITAMIN C) 100 MG tablet Take 500 mg by mouth daily. Takes 1000 mg daily      . ASPIRIN PO Take 81 mg by mouth daily.      . Calcium Carbonate (CALCIUM 600 PO) Take by mouth.      . cetirizine (ZYRTEC) 10 MG tablet Take 1 tablet (10 mg total) by mouth daily. For allergies  90 tablet  99  . cholecalciferol (VITAMIN D) 1000 UNITS tablet Take 1,000 Units by mouth daily.      . enalapril (VASOTEC) 20 MG tablet Take 20 mg by mouth daily.      Marland Kitchen. levothyroxine (SYNTHROID, LEVOTHROID) 50 MCG tablet Take 1 tablet (50 mcg total) by mouth daily before breakfast.  90 tablet  0  . LORazepam (ATIVAN) 1 MG tablet 1/2 to 1 tablet up to 3 x daily as needed for anxiety  90 tablet  5  . Omega-3 Fatty Acids (FISH OIL PO) Take 1,000 mg by mouth daily.      . sertraline (ZOLOFT) 50 MG tablet 1/2 to 1 tablet daily as directed for mood  30 tablet  99   No current facility-administered medications on file prior to visit.   Medical History:  Past Medical History  Diagnosis Date  . Hyperlipidemia   . Hypertension   . Pre-diabetes   . DJD (degenerative joint disease)   . Hypothyroid    Allergies:  Allergies  Allergen Reactions  . Alprazolam Other (See Comments)    constipation  . Citalopram Itching  . Prednisone   . Sulfa Antibiotics Rash     Review of Systems: [X]  = complains of  [ ]  = denies  General: Fatigue [ ]  Fever [ ]  Chills [ ]  Weakness [ ]   Insomnia [ ]  Eyes: Redness [ ]  Blurred vision [ ]  Diplopia [ ]   ENT: Congestion [ ]  Sinus Pain [ ]  Post Nasal Drip [ ]  Sore Throat [ ]  Earache [ ]   Cardiac: Chest pain/pressure [ ]  SOB [ ]  Orthopnea [ ]   Palpitations [ ]    Paroxysmal nocturnal dyspnea[ ]  Claudication [ ]  Edema [ ]   Pulmonary: Cough [ ]  Wheezing[ ]   SOB [ ]   Snoring [ ]   GI: Nausea [ ]  Vomiting[ ]  Dysphagia[ ]  Heartburn[ ]  Abdominal pain [ ]  Constipation [ ] ; Diarrhea [ ] ; BRBPR [ ]  Melena[ ]  GU: Hematuria[ ]  Dysuria [ ]  Nocturia[ ]  Urgency [ ]   Hesitancy [ ]  Discharge [ ]  Neuro: Headaches[ ]  Vertigo[ ]  Paresthesias[ ]  Spasm [ ]  Speech changes [ ]  Incoordination [ ]   Ortho: Arthritis [ ]  Joint pain [ ]  Muscle pain [ ]  Joint swelling [ ]  Back Pain [ ]  Skin:  Rash [ ]   Pruritis [ ]  Change in skin lesion [ ]   Psych: Depression[ ]  Anxiety[ ]  Confusion [ ]  Memory loss [ ]   Heme/Lypmh: Bleeding [ ]  Bruising [ ]  Enlarged lymph nodes [ ]   Endocrine: Visual blurring [ ]  Paresthesia [ ]  Polyuria [ ]  Polydypsea [ ]    Heat/cold intolerance [ ]  Hypoglycemia [ ]   Family history- Review and unchanged Social history- Review and unchanged Physical Exam: BP 128/78  Pulse 60  Temp(Src) 98.1 F (36.7 C)  Resp 16  Ht 5' 5.5" (1.664 m)  Wt 124 lb (56.246 kg)  BMI 20.31 kg/m2 Wt Readings from Last 3 Encounters:  02/01/14 124 lb (56.246 kg)  12/07/13 126 lb (57.153 kg)  11/01/13 124 lb 12.8 oz (56.609 kg)   General Appearance: Well nourished, in no apparent distress. Eyes: PERRLA, EOMs, conjunctiva no swelling or erythema Sinuses: No Frontal/maxillary tenderness ENT/Mouth: Ext aud canals clear, TMs without erythema, bulging. No erythema, swelling, or exudate on post pharynx.  Tonsils not swollen or erythematous. Hearing normal.  Neck: Supple, thyroid normal.  Respiratory: Respiratory effort normal, BS equal bilaterally without rales, rhonchi, wheezing or stridor.  Cardio: RRR with no MRGs. Brisk peripheral pulses without edema.  Abdomen: Soft, + BS.  Non tender, no guarding, rebound, hernias, masses. Lymphatics: Non tender without lymphadenopathy.  Musculoskeletal: Full ROM, 5/5 strength, normal gait.  Skin: Warm, dry without rashes, lesions, ecchymosis.   Neuro: Cranial nerves intact. Normal muscle tone, no cerebellar symptoms. Sensation intact.  Psych: Awake and oriented X 3, normal affect, Insight and Judgment appropriate.    Quentin Mullingollier, Ashritha Desrosiers 11:21 AM

## 2014-02-02 LAB — URINALYSIS, ROUTINE W REFLEX MICROSCOPIC
BILIRUBIN URINE: NEGATIVE
GLUCOSE, UA: NEGATIVE mg/dL
Hgb urine dipstick: NEGATIVE
KETONES UR: NEGATIVE mg/dL
Leukocytes, UA: NEGATIVE
Nitrite: NEGATIVE
PH: 6.5 (ref 5.0–8.0)
Protein, ur: NEGATIVE mg/dL
Specific Gravity, Urine: 1.011 (ref 1.005–1.030)
Urobilinogen, UA: 0.2 mg/dL (ref 0.0–1.0)

## 2014-02-02 LAB — URINE CULTURE
Colony Count: NO GROWTH
Organism ID, Bacteria: NO GROWTH

## 2014-02-27 ENCOUNTER — Other Ambulatory Visit: Payer: Self-pay | Admitting: Internal Medicine

## 2014-03-29 ENCOUNTER — Other Ambulatory Visit: Payer: Self-pay | Admitting: Internal Medicine

## 2014-03-29 MED ORDER — ACETAMINOPHEN-CODEINE #3 300-30 MG PO TABS: ORAL_TABLET | ORAL | Status: DC

## 2014-04-26 ENCOUNTER — Other Ambulatory Visit: Payer: Self-pay | Admitting: Physician Assistant

## 2014-05-07 ENCOUNTER — Ambulatory Visit: Payer: Self-pay | Admitting: Internal Medicine

## 2014-05-07 ENCOUNTER — Ambulatory Visit: Payer: Medicare Other | Admitting: Internal Medicine

## 2014-05-07 ENCOUNTER — Encounter: Payer: Self-pay | Admitting: Internal Medicine

## 2014-05-07 NOTE — Progress Notes (Signed)
Patient ID: Stephanie Weeks, female   DOB: 1933-02-03, 80 y.o.   MRN: [id number] Stephanie Weeks

## 2014-05-08 ENCOUNTER — Other Ambulatory Visit: Payer: Self-pay | Admitting: Internal Medicine

## 2014-05-08 MED ORDER — ACETAMINOPHEN-CODEINE #3 300-30 MG PO TABS: ORAL_TABLET | ORAL | Status: DC

## 2014-05-14 ENCOUNTER — Encounter: Payer: Self-pay | Admitting: Internal Medicine

## 2014-05-14 ENCOUNTER — Ambulatory Visit (INDEPENDENT_AMBULATORY_CARE_PROVIDER_SITE_OTHER): Payer: Medicare Other | Admitting: Internal Medicine

## 2014-05-14 VITALS — BP 130/64 | HR 72 | Temp 98.4°F | Resp 16 | Ht 65.5 in | Wt 114.0 lb

## 2014-05-14 DIAGNOSIS — Z79899 Other long term (current) drug therapy: Secondary | ICD-10-CM

## 2014-05-14 DIAGNOSIS — I10 Essential (primary) hypertension: Secondary | ICD-10-CM

## 2014-05-14 DIAGNOSIS — E559 Vitamin D deficiency, unspecified: Secondary | ICD-10-CM

## 2014-05-14 DIAGNOSIS — R7303 Prediabetes: Secondary | ICD-10-CM

## 2014-05-14 DIAGNOSIS — E785 Hyperlipidemia, unspecified: Secondary | ICD-10-CM

## 2014-05-14 DIAGNOSIS — R7309 Other abnormal glucose: Secondary | ICD-10-CM

## 2014-05-14 NOTE — Progress Notes (Signed)
Patient ID: Stephanie Weeks, female   DOB: May 06, 1933, 80 y.o.   MRN: [id number]   This very nice 80 y.o.MWF presents for 3 month follow up with Hypertension, Hyperlipidemia, Hypothyroidism, Pre-Diabetes and Vitamin D Deficiency. Today patient relates frustration in that she is the primary caregiver for her 28 yo husband who she feels is more capable than he lets on. She feels overwhelmed trying to care for their apt and care for him.   Patient is treated for HTN & BP has been controlled at home. Today's BP: 130/64 mmHg. Patient has had no complaints of any cardiac type chest pain, palpitations, dyspnea/orthopnea/PND, dizziness, claudication, or dependent edema.  Hyperlipidemia is controlled with diet & supplements. Patient denies myalgias or other med SE's. Last Lipids were Total Chol  198; HDL  65; LDL 114*; Trig 97 on  02/01/2014.   Also, the patient has history of  PreDiabetes (A1c 5.9% in 2012)  and has had no symptoms of reactive hypoglycemia, diabetic polys, paresthesias or visual blurring.  Last A1c was  5.7% on 02/01/2014.    Further, the patient also has history of Vitamin D Deficiency (29 in 2009) and supplements vitamin D without any suspected side-effects. Last vitamin D was  68 on 11/01/2013.   Medication List   acetaminophen-codeine 300-30 MG per tablet  Commonly known as:  TYLENOL #3  Take 1 tablet 4 x day if needed for pain     ASPIRIN PO  Take 81 mg by mouth daily.     CALCIUM 600 PO  Take by mouth.     cetirizine 10 MG tablet  Commonly known as:  ZYRTEC  Take 1 tablet (10 mg total) by mouth daily. For allergies     enalapril 20 MG tablet  Commonly known as:  VASOTEC  TAKE 1 TABLET EVERY DAY FOR BLOOD PRESSURE     FISH OIL PO  Take 1,000 mg by mouth daily.     levothyroxine 50 MCG tablet  Commonly known as:  SYNTHROID, LEVOTHROID  Take 1 tablet (50 mcg total) by mouth daily before breakfast.     LORazepam 1 MG tablet  Commonly known as:  ATIVAN  TAKE 1/2  TO 1 TABLET BY MOUTH 3 TIMES A DAY AS NEEDED FOR ANXIETY     vitamin C 100 MG tablet  Take 500 mg by mouth daily. Takes 1000 mg daily     Allergies  Allergen Reactions  . Alprazolam Other (See Comments)    constipation  . Citalopram Itching  . Prednisone     DUE TO GLACOMA  . Sulfa Antibiotics Rash   PMHx:   Past Medical History  Diagnosis Date  . Hyperlipidemia   . Hypertension   . Pre-diabetes   . DJD (degenerative joint disease)   . Hypothyroid    FHx:    Reviewed / unchanged  SHx:    Reviewed / unchanged   Systems Review:  Constitutional: Denies fever, chills, wt changes, headaches, insomnia, fatigue, night sweats, change in appetite. Eyes: Denies redness, has decreased vision due to corneal scarring, no diplopia, discharge, itchy, watery eyes.  ENT: Denies discharge, congestion, post nasal drip, epistaxis, sore throat, earache, hearing loss, dental pain, tinnitus, vertigo, sinus pain, snoring.  CV: Denies chest pain, palpitations, irregular heartbeat, syncope, dyspnea, diaphoresis, orthopnea, PND, claudication or edema. Respiratory: denies cough, dyspnea, DOE, pleurisy, hoarseness, laryngitis, wheezing.  Gastrointestinal: Denies dysphagia, odynophagia, heartburn, reflux, water brash, abdominal pain or cramps, nausea, vomiting, bloating, diarrhea, constipation, hematemesis, melena, hematochezia  or  hemorrhoids. Genitourinary: Denies dysuria, frequency, urgency, nocturia, hesitancy, discharge, hematuria or flank pain. Musculoskeletal: Denies arthralgias, myalgias, stiffness, jt. swelling, pain, limping or strain/sprain.  Skin: Denies pruritus, rash, hives, warts, acne, eczema or change in skin lesion(s). Neuro: No weakness, tremor, incoordination, spasms, paresthesia or pain. Psychiatric: Denies confusion, memory loss or sensory loss. Endo: Denies change in weight, skin or hair change.  Heme/Lymph: No excessive bleeding, bruising or enlarged lymph nodes.  Exam:  BP  130/64  Pulse 72  Temp 98.4 F   Resp 16  Ht 5' 5.5"   Wt 114 lb   BMI 18.68  Appears well nourished and in no distress. Eyes: PERRLA, EOMs, conjunctiva no swelling or erythema. Sinuses: No frontal/maxillary tenderness ENT/Mouth: EAC's clear, TM's nl w/o erythema, bulging. Nares clear w/o erythema, swelling, exudates. Oropharynx clear without erythema or exudates. Oral hygiene is good. Tongue normal, non obstructing. Hearing intact.  Neck: Supple. Thyroid nl. Car 2+/2+ without bruits, nodes or JVD. Chest: Respirations nl with BS clear & equal w/o rales, rhonchi, wheezing or stridor.  Cor: Heart sounds normal w/ regular rate and rhythm without sig. murmurs, gallops, clicks, or rubs. Peripheral pulses normal and equal  without edema.  Abdomen: Soft & bowel sounds normal. Non-tender w/o guarding, rebound, hernias, masses, or organomegaly.  Lymphatics: Unremarkable.  Musculoskeletal: Full ROM all peripheral extremities, joint stability, 5/5 strength, and normal gait.  Skin: Warm, dry without exposed rashes, lesions or ecchymosis apparent.  Neuro: Cranial nerves intact, reflexes equal bilaterally. Sensory-motor testing grossly intact. Tendon reflexes grossly intact.  Pysch: Alert & oriented x 3.  Insight and judgement nl & appropriate. No ideations.  Assessment and Plan:  1. Hypertension - Continue monitor blood pressure at home. Continue diet/meds same.  2. Hyperlipidemia - Continue diet/meds, exercise,& lifestyle modifications. Continue monitor periodic cholesterol/liver & renal functions   3.  Pre-Diabetes - Continue diet, exercise, lifestyle modifications. Monitor appropriate labs.  4. Vitamin D Deficiency - Continue supplementation.   Recommended regular exercise, BP monitoring, weight control, and discussed med and SE's. Recommended labs to assess and monitor clinical status. Further disposition pending results of labs.

## 2014-05-15 LAB — LIPID PANEL
CHOLESTEROL: 183 mg/dL (ref 0–200)
HDL: 63 mg/dL (ref >39)
LDL Cholesterol: 96 mg/dL (ref 0–99)
TRIGLYCERIDES: 118 mg/dL (ref <150)
Total CHOL/HDL Ratio: 2.9 Ratio
VLDL: 24 mg/dL (ref 0–40)

## 2014-05-15 LAB — CBC WITH DIFFERENTIAL/PLATELET
BASOS PCT: 0 % (ref 0–1)
Basophils Absolute: 0 10*3/uL (ref 0.0–0.1)
EOS PCT: 1 % (ref 0–5)
Eosinophils Absolute: 0.1 10*3/uL (ref 0.0–0.7)
HEMATOCRIT: 41.4 % (ref 36.0–46.0)
Hemoglobin: 14.3 g/dL (ref 12.0–15.0)
Lymphocytes Relative: 23 % (ref 12–46)
Lymphs Abs: 1.9 10*3/uL (ref 0.7–4.0)
MCH: 31.6 pg (ref 26.0–34.0)
MCHC: 34.5 g/dL (ref 30.0–36.0)
MCV: 91.6 fL (ref 78.0–100.0)
MONO ABS: 0.7 10*3/uL (ref 0.1–1.0)
Monocytes Relative: 8 % (ref 3–12)
Neutro Abs: 5.6 10*3/uL (ref 1.7–7.7)
Neutrophils Relative %: 68 % (ref 43–77)
Platelets: 367 10*3/uL (ref 150–400)
RBC: 4.52 MIL/uL (ref 3.87–5.11)
RDW: 14.7 % (ref 11.5–15.5)
WBC: 8.2 10*3/uL (ref 4.0–10.5)

## 2014-05-15 LAB — HEPATIC FUNCTION PANEL
ALBUMIN: 4.6 g/dL (ref 3.5–5.2)
ALK PHOS: 98 U/L (ref 39–117)
ALT: 10 U/L (ref 0–35)
AST: 18 U/L (ref 0–37)
Bilirubin, Direct: 0.1 mg/dL (ref 0.0–0.3)
Indirect Bilirubin: 0.2 mg/dL (ref 0.2–1.2)
Total Bilirubin: 0.3 mg/dL (ref 0.2–1.2)
Total Protein: 7.4 g/dL (ref 6.0–8.3)

## 2014-05-15 LAB — INSULIN, FASTING: INSULIN FASTING, SERUM: 6.6 u[IU]/mL (ref 2.0–19.6)

## 2014-05-15 LAB — BASIC METABOLIC PANEL WITH GFR
BUN: 16 mg/dL (ref 6–23)
CO2: 27 mEq/L (ref 19–32)
CREATININE: 0.66 mg/dL (ref 0.50–1.10)
Calcium: 9.8 mg/dL (ref 8.4–10.5)
Chloride: 101 mEq/L (ref 96–112)
GFR, EST NON AFRICAN AMERICAN: 84 mL/min
GLUCOSE: 95 mg/dL (ref 70–99)
Potassium: 3.8 mEq/L (ref 3.5–5.3)
Sodium: 140 mEq/L (ref 135–145)

## 2014-05-15 LAB — MAGNESIUM: Magnesium: 2.2 mg/dL (ref 1.5–2.5)

## 2014-05-15 LAB — TSH: TSH: 1.565 u[IU]/mL (ref 0.350–4.500)

## 2014-05-15 LAB — HEMOGLOBIN A1C
HEMOGLOBIN A1C: 5.8 % — AB (ref <5.7)
Mean Plasma Glucose: 120 mg/dL — ABNORMAL HIGH (ref <117)

## 2014-05-15 LAB — VITAMIN D 25 HYDROXY (VIT D DEFICIENCY, FRACTURES): Vit D, 25-Hydroxy: 82 ng/mL (ref 30–89)

## 2014-05-29 ENCOUNTER — Other Ambulatory Visit: Payer: Self-pay | Admitting: Internal Medicine

## 2014-05-30 ENCOUNTER — Other Ambulatory Visit: Payer: Self-pay | Admitting: Internal Medicine

## 2014-06-06 ENCOUNTER — Other Ambulatory Visit: Payer: Self-pay | Admitting: *Deleted

## 2014-06-06 ENCOUNTER — Encounter: Payer: Self-pay | Admitting: Internal Medicine

## 2014-06-06 ENCOUNTER — Ambulatory Visit (INDEPENDENT_AMBULATORY_CARE_PROVIDER_SITE_OTHER): Payer: Medicare Other | Admitting: Internal Medicine

## 2014-06-06 VITALS — BP 126/82 | HR 72 | Temp 99.1°F | Resp 16 | Ht 65.5 in | Wt 111.6 lb

## 2014-06-06 DIAGNOSIS — G308 Other Alzheimer's disease: Secondary | ICD-10-CM

## 2014-06-06 DIAGNOSIS — Z23 Encounter for immunization: Secondary | ICD-10-CM

## 2014-06-06 DIAGNOSIS — R7303 Prediabetes: Secondary | ICD-10-CM

## 2014-06-06 DIAGNOSIS — F0280 Dementia in other diseases classified elsewhere without behavioral disturbance: Secondary | ICD-10-CM

## 2014-06-06 DIAGNOSIS — R7309 Other abnormal glucose: Secondary | ICD-10-CM

## 2014-06-06 DIAGNOSIS — Z111 Encounter for screening for respiratory tuberculosis: Secondary | ICD-10-CM

## 2014-06-06 DIAGNOSIS — G301 Alzheimer's disease with late onset: Principal | ICD-10-CM

## 2014-06-06 DIAGNOSIS — I10 Essential (primary) hypertension: Secondary | ICD-10-CM

## 2014-06-06 DIAGNOSIS — E039 Hypothyroidism, unspecified: Secondary | ICD-10-CM

## 2014-06-06 MED ORDER — LEVOTHYROXINE SODIUM 50 MCG PO TABS: 50.0000 ug | ORAL_TABLET | Freq: Every day | ORAL | Status: AC

## 2014-06-06 NOTE — Progress Notes (Signed)
Subjective:    Patient ID: Stephanie Weeks, female    DOB: 1933-05-16, 80 y.o.   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    Patient reports frequent periods of confusion, difficult short term memory recall, difficulty understanding questions or how to perform tasks she had previously been able to easily perform.  She's unable to safely operate a stove or microwave or prepare a meal.   She requires supervision and assistance in personal hygiene, grooming and dressing.   She is occasionally incontinent of urine and wears  hygiene pads.   She is generally moderately deconditioned with decreased muscle power tone and bulk.   Medication Sig  . acetaminophen-codeine (TYLENOL #3) 300-30 MG per tablet Take 1 tablet 4 x day if needed for pain  . ASPIRIN PO Take 81 mg by mouth daily.  . cetirizine (ZYRTEC) 10 MG tablet Take 1 tablet (10 mg total) by mouth daily. For allergies  . enalapril (VASOTEC) 20 MG tablet TAKE 1 TABLET EVERY DAY FOR BLOOD PRESSURE  . LORazepam (ATIVAN) 1 MG tablet TAKE 1/2 TO 1 TABLET 3 TIMES A DAY AS NEEDED  . Ascorbic Acid (VITAMIN C) 100 MG tablet Take 500 mg by mouth daily. Takes 1000 mg daily  . Calcium Carbonate (CALCIUM 600 PO) Take by mouth.  . levothyroxine (SYNTHROID, LEVOTHROID) 50 MCG tablet Take 1 tablet (50 mcg total) by mouth daily before breakfast.  . Omega-3 Fatty Acids (FISH OIL PO) Take 1,000 mg by mouth daily.   Allergies  Allergen Reactions  . Alprazolam Other (See Comments)    constipation  . Citalopram Itching  . Prednisone     DUE TO GLACOMA  . Sulfa Antibiotics Rash   Past Medical History  Diagnosis Date  . Hyperlipidemia   . Hypertension   . Pre-diabetes   . DJD (degenerative joint disease)   .  Hypothyroid    Past Surgical History  Procedure Laterality Date  . Total abdominal hysterectomy  1985  . Corneal transplant Left 1985  . Flexible sigmoidoscopy  [id number]  . Myringotomy Left 1990  . Corneal transplant Right 1991  . Knee arthroscopy Right 1995  . Enterocele repair  2001    posterior  . Retinal detachment surgery Right 2001  . Cardiac catheterization      negative  . Lipoma excision Left 2004    parascapular lipoma. benign.   Review of Systems 10 point system review is negative except as above.  Objective:   Physical Exam  BP 126/82  Pulse 72  Temp(Src) 99.1 F (37.3 C)  Resp 16  Ht 5' 5.5" (1.664 m)  Wt 111 lb 9.6 oz (50.621 kg)  BMI 18.28 kg/m2  Patient is in no distress and appears well groomed. Vision is limited to finger counting with the left eye. Patient is intermittently tearful as she recognizes her cognitive difficulties.  History was taken to complete [facility] - form 2680  (10 minutes).  FL-2 Form was completed (5 min)   MMSE was administered (10 min) with a score of 18/30.  Assessment & Plan:   1. SDAT (senile dementia of Alzheimer's type)  2. Essential hypertension  3. Hypothyroidism  4. Pre-diabetes  5. Vitamin D Deficiency  6. 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 Insomnia  Discussed occasional drug holiday for sleeping meds & limiting dose Ativan to no more than 1 or  2 at bedtime.

## 2014-06-06 NOTE — Patient Instructions (Signed)

## 2014-06-07 DIAGNOSIS — Z111 Encounter for screening for respiratory tuberculosis: Secondary | ICD-10-CM

## 2014-06-07 DIAGNOSIS — Z23 Encounter for immunization: Secondary | ICD-10-CM

## 2014-06-07 NOTE — Addendum Note (Signed)
Addended by: Valrie HartEVANS, Miyu Fenderson C on: 06/07/2014 08:24 AM   Modules accepted: Orders

## 2014-06-11 LAB — TB SKIN TEST
Induration: 0 mm
TB Skin Test: NEGATIVE

## 2014-06-15 ENCOUNTER — Other Ambulatory Visit: Payer: Self-pay | Admitting: Internal Medicine

## 2014-08-20 ENCOUNTER — Ambulatory Visit: Payer: Self-pay | Admitting: Physician Assistant

## 2014-08-21 ENCOUNTER — Ambulatory Visit: Payer: Medicare Other | Admitting: Physician Assistant

## 2014-09-29 ENCOUNTER — Other Ambulatory Visit: Payer: Self-pay | Admitting: Physician Assistant

## 2014-11-05 ENCOUNTER — Encounter: Payer: Self-pay | Admitting: Internal Medicine

## 2014-12-04 ENCOUNTER — Encounter: Payer: Self-pay | Admitting: Internal Medicine

## 2014-12-06 ENCOUNTER — Encounter: Payer: Self-pay | Admitting: Internal Medicine

## 2018-05-17 ENCOUNTER — Emergency Department (HOSPITAL_COMMUNITY): Payer: Medicare Other

## 2018-05-17 ENCOUNTER — Other Ambulatory Visit: Payer: Self-pay

## 2018-05-17 ENCOUNTER — Encounter (HOSPITAL_COMMUNITY): Payer: Self-pay | Admitting: *Deleted

## 2018-05-17 ENCOUNTER — Emergency Department (HOSPITAL_COMMUNITY)
Admission: EM | Admit: 2018-05-17 | Discharge: 2018-05-17 | Disposition: A | Discharge status: Home / Self Care | Payer: Medicare Other | Attending: Emergency Medicine | Admitting: Emergency Medicine

## 2018-05-17 DIAGNOSIS — W19XXXA Unspecified fall, initial encounter: Secondary | ICD-10-CM | POA: Insufficient documentation

## 2018-05-17 DIAGNOSIS — E039 Hypothyroidism, unspecified: Secondary | ICD-10-CM | POA: Insufficient documentation

## 2018-05-17 DIAGNOSIS — Z7982 Long term (current) use of aspirin: Secondary | ICD-10-CM | POA: Diagnosis not present

## 2018-05-17 DIAGNOSIS — S82202A Unspecified fracture of shaft of left tibia, initial encounter for closed fracture: Secondary | ICD-10-CM | POA: Insufficient documentation

## 2018-05-17 DIAGNOSIS — S82402A Unspecified fracture of shaft of left fibula, initial encounter for closed fracture: Secondary | ICD-10-CM | POA: Insufficient documentation

## 2018-05-17 DIAGNOSIS — F0390 Unspecified dementia without behavioral disturbance: Secondary | ICD-10-CM | POA: Diagnosis not present

## 2018-05-17 DIAGNOSIS — R52 Pain, unspecified: Secondary | ICD-10-CM

## 2018-05-17 DIAGNOSIS — Y939 Activity, unspecified: Secondary | ICD-10-CM | POA: Diagnosis not present

## 2018-05-17 DIAGNOSIS — Y929 Unspecified place or not applicable: Secondary | ICD-10-CM | POA: Diagnosis not present

## 2018-05-17 DIAGNOSIS — Z79899 Other long term (current) drug therapy: Secondary | ICD-10-CM | POA: Insufficient documentation

## 2018-05-17 DIAGNOSIS — S8992XA Unspecified injury of left lower leg, initial encounter: Secondary | ICD-10-CM | POA: Diagnosis present

## 2018-05-17 DIAGNOSIS — I10 Essential (primary) hypertension: Secondary | ICD-10-CM | POA: Diagnosis not present

## 2018-05-17 DIAGNOSIS — Y999 Unspecified external cause status: Secondary | ICD-10-CM | POA: Insufficient documentation

## 2018-05-17 HISTORY — DX: Unspecified dementia, unspecified severity, without behavioral disturbance, psychotic disturbance, mood disturbance, and anxiety: F03.90

## 2018-05-17 MED ORDER — ACETAMINOPHEN 325 MG PO TABS
650.0000 mg | ORAL_TABLET | Freq: Once | ORAL | Status: AC
  Administered 2018-05-17: 650 mg via ORAL
  Filled 2018-05-17: qty 2

## 2018-05-17 MED ORDER — METHOCARBAMOL 500 MG PO TABS: 500.0000 mg | ORAL_TABLET | Freq: Two times a day (BID) | ORAL | 0 refills | Status: AC

## 2018-05-17 MED ORDER — HYDROCODONE-ACETAMINOPHEN 5-325 MG PO TABS
1.0000 | ORAL_TABLET | Freq: Once | ORAL | Status: AC
  Administered 2018-05-17: 1 via ORAL
  Filled 2018-05-17: qty 1

## 2018-05-17 NOTE — ED Notes (Signed)
Pt discharged to home. Dc instructions given to dtr at bedside. Pt's dtr encouraged to pick up medication that has been e-prescribed by MD and to also call to f/u with orthopedic MD. Voiced understanding. Left unit in wheelchair pushed by nurse tech accompanied by dtr. Left in stable condition. Medicated for pain prior to discharge.  Derinda Sis.

## 2018-05-17 NOTE — ED Provider Notes (Signed)
[facility]-EMERGENCY DEPT Provider Note   CSN: [id number] Arrival date & time: 05/17/18  1438     History   Chief Complaint Chief Complaint  Patient presents with  . Fall    HPI Stephanie Weeks is a 84 y.o. female.  84 year old female here after mechanical fall which occurred approximately 4 hours prior to arrival.  Denied any head injury from this.  Has been in her baseline state of health.  Her current mental status is appropriate according to her daughter.  She complains of pain to her left knee as well as left hip and thoracic and lumbar spine.  Also has pain to her right humerus.  All the pain is worse with movement and characterizes sharp.  Patient does normally use a walker.  Presents via EMS     Past Medical History:  Diagnosis Date  . Dementia (HCC)   . DJD (degenerative joint disease)   . Hyperlipidemia   . Hypertension   . Hypothyroid   . Pre-diabetes     Patient Active Problem List   Diagnosis Date Noted  . Vitamin D deficiency 11/01/2013  . Encounter for long-term (current) use of other medications 11/01/2013  . Rt. Corneal scarring with Blindness 11/01/2013  . 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 11/01/2013  . Hyperlipidemia   . Hypertension   . Pre-diabetes   . DJD (degenerative joint disease)   . Hypothyroid     Past Surgical History:  Procedure Laterality Date  . CARDIAC CATHETERIZATION     negative  . CORNEAL TRANSPLANT Left 1985  . CORNEAL TRANSPLANT Right 1991  . ENTEROCELE REPAIR  2001   posterior  . FLEXIBLE SIGMOIDOSCOPY  [id number]  . KNEE ARTHROSCOPY Right 1995  . LIPOMA EXCISION Left 2004   parascapular lipoma. benign.  Marland Kitchen MYRINGOTOMY Left 1990  . RETINAL DETACHMENT SURGERY Right 2001  . TOTAL ABDOMINAL HYSTERECTOMY  1985     OB History   None      Home Medications    Prior to Admission medications   Medication Sig Start Date End Date Taking? Authorizing Provider  acetaminophen-codeine (TYLENOL  #3) 300-30 MG per tablet TAKE 1 TABLET BY MOUTH 4 TIMES A DAY AS NEEDED FOR PAIN 06/16/14   Quentin Mulling, PA-C  ASPIRIN PO Take 81 mg by mouth daily.    [provider]  cetirizine (ZYRTEC) 10 MG tablet Take 1 tablet (10 mg total) by mouth daily. For allergies 08/07/13   Lucky Cowboy, MD  enalapril (VASOTEC) 20 MG tablet TAKE 1 TABLET EVERY DAY FOR BLOOD PRESSURE 02/27/14   Quentin Mulling, PA-C  levothyroxine (SYNTHROID, LEVOTHROID) 50 MCG tablet Take 1 tablet (50 mcg total) by mouth daily before breakfast. 06/06/14   Lucky Cowboy, MD  LORazepam (ATIVAN) 1 MG tablet TAKE 1/2 TO 1 TABLET 3 TIMES A DAY AS NEEDED 05/30/14   Quentin Mulling, PA-C    Family History Family History  Problem Relation Age of Onset  . Hypertension Mother   . Stroke Mother   . Heart disease Father   . Heart attack Father   . Heart disease Brother   . Hypertension Brother   . Hypertension Brother   . Hypertension Sister   . Hypertension Sister   . Cancer Sister        ovary  . Hypertension Daughter     Social History Social History   Tobacco Use  . Smoking status: Never Smoker  . Smokeless tobacco: Never Used  Substance Use Topics  . Alcohol  use: No  . Drug use: No     Allergies   Alprazolam; Citalopram; Prednisone; and Sulfa antibiotics   Review of Systems Review of Systems  All other systems reviewed and are negative.    Physical Exam Updated Vital Signs BP (!) 156/86   Pulse 80   Temp 99.3 F (37.4 C) (Oral)   Resp 16   Ht 1.676 m (5\' 6" )   Wt 54.4 kg   SpO2 99%   BMI 19.37 kg/m   Physical Exam  Constitutional: She is oriented to person, place, and time. She appears well-developed and well-nourished.  Non-toxic appearance. No distress.  HENT:  Head: Normocephalic and atraumatic.  Eyes: Pupils are equal, round, and reactive to light. Conjunctivae, EOM and lids are normal.  Neck: Normal range of motion. Neck supple. No spinous process tenderness and no muscular  tenderness present. No tracheal deviation and normal range of motion present. No thyroid mass present.  Cardiovascular: Normal rate, regular rhythm and normal heart sounds. Exam reveals no gallop.  No murmur heard. Pulmonary/Chest: Effort normal and breath sounds normal. No stridor. No respiratory distress. She has no decreased breath sounds. She has no wheezes. She has no rhonchi. She has no rales.  Abdominal: Soft. Normal appearance and bowel sounds are normal. She exhibits no distension. There is no tenderness. There is no rebound and no CVA tenderness.  Musculoskeletal: She exhibits no edema.       Left knee: She exhibits decreased range of motion and swelling. She exhibits no effusion.       Back:       Right upper arm: She exhibits tenderness. She exhibits no swelling and no deformity.       Arms:      Legs: Neurological: She is alert and oriented to person, place, and time. She has normal strength. No cranial nerve deficit or sensory deficit. GCS eye subscore is 4. GCS verbal subscore is 5. GCS motor subscore is 6.  Skin: Skin is warm and dry. No abrasion and no rash noted.  Psychiatric: She has a normal mood and affect. Her speech is normal and behavior is normal.  Nursing note and vitals reviewed.    ED Treatments / Results  Labs (all labs ordered are listed, but only abnormal results are displayed) Labs Reviewed - No data to display  EKG None  Radiology No results found.  Procedures Procedures (including critical care time)  Medications Ordered in ED Medications  acetaminophen (TYLENOL) tablet 650 mg (has no administration in time range)     Initial Impression / Assessment and Plan / ED Course  I have reviewed the triage vital signs and the nursing notes.  Pertinent labs & imaging results that were available during my care of the patient were reviewed by me and considered in my medical decision making (see chart for details).     Patient had plain knee films of  the left side which show possible tibial plateau fracture.  CT of the knee shows nondisplaced medial tibial plateau fracture.  X-rays of T-spine show possible thoracic spine fracture and radiologist recommended MRI which was negative for acute fracture.  Discussed case with Dr. Ophelia Charter from orthopedics and he will see the patient in follow-up.  Patient placed in a knee immobilizer and will be discharged home  Final Clinical Impressions(s) / ED Diagnoses   Final diagnoses:  None    ED Discharge Orders    None       Lorre Nick, MD 05/17/18  2252  

## 2018-05-17 NOTE — Discharge Instructions (Signed)
Call the orthopedic surgeon tomorrow to schedule a follow-up visit.  Your MRI did not show any acute spinal fractures today

## 2018-05-17 NOTE — ED Triage Notes (Signed)
EMS states pt lives at [facility]. She fell approx 4 hours ago, Family requested she come in and be checked.. Only complaints low back pain and left kneed pain. Pt was ambulatory upon EMS arrival. 141/85-80-96% RA CBG 98

## 2018-05-20 ENCOUNTER — Encounter (INDEPENDENT_AMBULATORY_CARE_PROVIDER_SITE_OTHER): Payer: Self-pay | Admitting: Orthopaedic Surgery

## 2018-05-20 ENCOUNTER — Ambulatory Visit (INDEPENDENT_AMBULATORY_CARE_PROVIDER_SITE_OTHER): Payer: Medicare Other | Admitting: Orthopaedic Surgery

## 2018-05-20 VITALS — BP 121/72 | HR 103 | Ht 66.0 in | Wt 140.0 lb

## 2018-05-20 DIAGNOSIS — S82142A Displaced bicondylar fracture of left tibia, initial encounter for closed fracture: Secondary | ICD-10-CM | POA: Diagnosis not present

## 2018-05-20 NOTE — Progress Notes (Signed)
Office Visit Note   Patient: Stephanie Weeks           Date of Birth: 23-Dec-1932           MRN: [id number] Visit Date: 05/20/2018              Requested by: No referring provider defined for this encounter. PCP: System, Pcp Not In   Assessment & Plan: Visit Diagnoses:  1. Closed fracture of left tibial plateau, initial encounter     Plan: Essentially nondisplaced tibial plateau fracture no surgery is required.  I plan to check her back again in 4 weeks for AP lateral left knee x-rays.  She remains nonweightbearing to prevent displacement.  She can weight-bear on the right lower extremity for transfers.  X-ray and CT results were reviewed with patient and family.  Follow-Up Instructions: Return in about 4 weeks (around 06/17/2018).   Orders:  No orders of the defined types were placed in this encounter.  No orders of the defined types were placed in this encounter.     Procedures: No procedures performed   Clinical Data: No additional findings.   Subjective: Chief Complaint  Patient presents with  . Left Knee - Fracture    DOI 05/17/18    HPI 84 year old female seen post fall and she resides at [facility].  She is seen at [facility] along ED where x-rays and CT were obtained.  This showed nondisplaced medial tibial plateau fracture with large joint hemarthrosis.  She is placed in a knee immobilizer and is seen for follow-up.  Review of Systems is systems positive for scarring with blindness joint degeneration, hyperlipidemia, hypertension, prediabetes, hypothyroidism and vitamin D deficiency.   Objective: Vital Signs: BP 121/72   Pulse (!) 103   Ht 5\' 6"  (1.676 m)   Wt 140 lb (63.5 kg)   BMI 22.60 kg/m   Physical Exam  Constitutional: She is oriented to person, place, and time. She appears well-developed.  HENT:  Head: Normocephalic.  Right Ear: External ear normal.  Left Ear: External ear normal.  Eyes: EOM are normal.  Decreased visual acuity    Neck: No tracheal deviation present. No thyromegaly present.  Cardiovascular: Normal rate.  Pulmonary/Chest: Effort normal.  Abdominal: Soft.  Neurological: She is alert and oriented to person, place, and time.  Skin: Skin is warm and dry.  Psychiatric: She has a normal mood and affect. Her behavior is normal.    Ortho Exam distal pulses are intact.  Sciatic sensation is intact.  There is mild hemarthrosis of the knee.  No pain with hip range of motion.  Sensation of the foot is intact.  No other extremity injuries with the fall.  Specialty Comments:  No specialty comments available.  Imaging:CLINICAL DATA:  Left knee pain after fall.  EXAM: CT OF THE left KNEE WITHOUT CONTRAST  TECHNIQUE: Multidetector CT imaging of the left knee was performed according to the standard protocol. Multiplanar CT image reconstructions were also generated.  COMPARISON:  None.  FINDINGS: Nondisplaced fracture is seen involving the medial tibial plateau. Moderate narrowing of lateral joint space is noted with osteophyte formation. Large suprapatellar joint effusion is noted. No other fracture or dislocation is noted. Mild patellar spurring is noted. No significant soft tissue abnormality is noted.  IMPRESSION: Nondisplaced medial tibial plateau fracture is noted. Large suprapatellar joint effusion is noted.   Electronically Signed   By: Lupita Raider, M.D.   On: 05/17/2018 19:42    PMFS History:  Patient Active Problem List   Diagnosis Date Noted  . Vitamin D deficiency 11/01/2013  . Encounter for long-term (current) use of other medications 11/01/2013  . Rt. Corneal scarring with Blindness 11/01/2013  . 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 11/01/2013  . Hyperlipidemia   . Hypertension   . Pre-diabetes   . DJD (degenerative joint disease)   . Hypothyroid    Past Medical History:  Diagnosis Date  . Dementia (HCC)   . DJD (degenerative joint disease)   . Hyperlipidemia   .  Hypertension   . Hypothyroid   . Pre-diabetes     Family History  Problem Relation Age of Onset  . Hypertension Mother   . Stroke Mother   . Heart disease Father   . Heart attack Father   . Heart disease Brother   . Hypertension Brother   . Hypertension Brother   . Hypertension Sister   . Hypertension Sister   . Cancer Sister        ovary  . Hypertension Daughter     Past Surgical History:  Procedure Laterality Date  . CARDIAC CATHETERIZATION     negative  . CORNEAL TRANSPLANT Left 1985  . CORNEAL TRANSPLANT Right 1991  . ENTEROCELE REPAIR  2001   posterior  . FLEXIBLE SIGMOIDOSCOPY  [id number]  . KNEE ARTHROSCOPY Right 1995  . LIPOMA EXCISION Left 2004   parascapular lipoma. benign.  Marland Kitchen MYRINGOTOMY Left 1990  . RETINAL DETACHMENT SURGERY Right 2001  . TOTAL ABDOMINAL HYSTERECTOMY  1985   Social History   Occupational History  . Not on file  Tobacco Use  . Smoking status: Never Smoker  . Smokeless tobacco: Never Used  Substance and Sexual Activity  . Alcohol use: No  . Drug use: No  . Sexual activity: Not on file

## 2018-05-24 ENCOUNTER — Encounter (INDEPENDENT_AMBULATORY_CARE_PROVIDER_SITE_OTHER): Payer: Self-pay | Admitting: Orthopaedic Surgery

## 2018-06-17 ENCOUNTER — Ambulatory Visit (INDEPENDENT_AMBULATORY_CARE_PROVIDER_SITE_OTHER): Payer: Medicare Other | Admitting: Orthopaedic Surgery

## 2018-06-28 ENCOUNTER — Ambulatory Visit (INDEPENDENT_AMBULATORY_CARE_PROVIDER_SITE_OTHER): Payer: Medicare Other | Admitting: Orthopaedic Surgery

## 2018-06-28 ENCOUNTER — Encounter (INDEPENDENT_AMBULATORY_CARE_PROVIDER_SITE_OTHER): Payer: Self-pay | Admitting: Orthopaedic Surgery

## 2018-06-28 ENCOUNTER — Ambulatory Visit (INDEPENDENT_AMBULATORY_CARE_PROVIDER_SITE_OTHER): Payer: Medicare Other

## 2018-06-28 VITALS — BP 126/84 | HR 82 | Ht 66.0 in | Wt 140.0 lb

## 2018-06-28 DIAGNOSIS — S82142A Displaced bicondylar fracture of left tibia, initial encounter for closed fracture: Secondary | ICD-10-CM

## 2018-06-28 NOTE — Progress Notes (Signed)
Office Visit Note   Patient: Stephanie Weeks           Date of Birth: 10-29-1932           MRN: [id number] Visit Date: 06/28/2018              Requested by: No referring provider defined for this encounter. PCP: System, Pcp Not In   Assessment & Plan: Visit Diagnoses:  1. Closed fracture of left tibial plateau, initial encounter     Plan: Follow-up medial tibial plateau fracture.  She has had some slight further depression of the medial  plateau.  Follow-Up Instructions: No follow-ups on file.   Orders:  Orders Placed This Encounter  Procedures  . XR Knee 1-2 Views Left   No orders of the defined types were placed in this encounter.     Procedures: No procedures performed   Clinical Data: No additional findings.   Subjective: Chief Complaint  Patient presents with  . Left Knee - Fracture, Follow-up    DOI 05/17/18 Left Tibial Plateau Fracture    HPI 84 year old female returns for follow-up medial tibial plateau fracture.  She does not have her knee immobilizer somehow got lost misplaced or was thrown away.  She put some weight on it when she gets to the bathroom on and off the toilet.  She still not able to tolerate full weight on it she tells me.  She does have less pain than she had last visit.  Review of Systems 14 point update unchanged from last office visit.   Objective: Vital Signs: BP 126/84   Pulse 82   Ht 5\' 6"  (1.676 m)   Wt 140 lb (63.5 kg)   BMI 22.60 kg/m   Physical Exam  Constitutional: She is oriented to person, place, and time. She appears well-developed.  HENT:  Head: Normocephalic.  Right Ear: External ear normal.  Left Ear: External ear normal.  Eyes: Pupils are equal, round, and reactive to light.  Neck: No tracheal deviation present. No thyromegaly present.  Cardiovascular: Normal rate.  Pulmonary/Chest: Effort normal.  Abdominal: Soft.  Neurological: She is alert and oriented to person, place, and time.  Skin: Skin  is warm and dry.  Psychiatric: She has a normal mood and affect. Her behavior is normal.    Ortho Exam mild knee effusion trace lower extremity edema fairly symmetrical.  She lacks about 10 degrees reaching full extension of her left knee.  She takes fairly good quad strength against resistance some tenderness of the medial tibial plateau.  Specialty Comments:  No specialty comments available.  Imaging: No results found.   PMFS History: Patient Active Problem List   Diagnosis Date Noted  . Vitamin D deficiency 11/01/2013  . Encounter for long-term (current) use of other medications 11/01/2013  . Rt. Corneal scarring with Blindness 11/01/2013  . 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 11/01/2013  . Hyperlipidemia   . Hypertension   . Pre-diabetes   . DJD (degenerative joint disease)   . Hypothyroid    Past Medical History:  Diagnosis Date  . Dementia (HCC)   . DJD (degenerative joint disease)   . Hyperlipidemia   . Hypertension   . Hypothyroid   . Pre-diabetes     Family History  Problem Relation Age of Onset  . Hypertension Mother   . Stroke Mother   . Heart disease Father   . Heart attack Father   . Heart disease Brother   . Hypertension Brother   . Hypertension  Brother   . Hypertension Sister   . Hypertension Sister   . Cancer Sister        ovary  . Hypertension Daughter     Past Surgical History:  Procedure Laterality Date  . CARDIAC CATHETERIZATION     negative  . CORNEAL TRANSPLANT Left 1985  . CORNEAL TRANSPLANT Right 1991  . ENTEROCELE REPAIR  2001   posterior  . FLEXIBLE SIGMOIDOSCOPY  [id number],1995  . KNEE ARTHROSCOPY Right 1995  . LIPOMA EXCISION Left 2004   parascapular lipoma. benign.  Marland Kitchen. MYRINGOTOMY Left 1990  . RETINAL DETACHMENT SURGERY Right 2001  . TOTAL ABDOMINAL HYSTERECTOMY  1985   Social History   Occupational History  . Not on file  Tobacco Use  . Smoking status: Never Smoker  . Smokeless tobacco: Never Used  Substance and Sexual  Activity  . Alcohol use: No  . Drug use: No  . Sexual activity: Not on file

## 2018-07-19 ENCOUNTER — Encounter (INDEPENDENT_AMBULATORY_CARE_PROVIDER_SITE_OTHER): Payer: Self-pay | Admitting: Orthopaedic Surgery

## 2018-07-19 ENCOUNTER — Ambulatory Visit (INDEPENDENT_AMBULATORY_CARE_PROVIDER_SITE_OTHER): Payer: Medicare Other

## 2018-07-19 ENCOUNTER — Ambulatory Visit (INDEPENDENT_AMBULATORY_CARE_PROVIDER_SITE_OTHER): Payer: Medicare Other | Admitting: Orthopaedic Surgery

## 2018-07-19 VITALS — BP 138/83 | HR 83 | Ht 66.0 in | Wt 140.0 lb

## 2018-07-19 DIAGNOSIS — S82142A Displaced bicondylar fracture of left tibia, initial encounter for closed fracture: Secondary | ICD-10-CM

## 2018-07-19 NOTE — Progress Notes (Signed)
Office Visit Note   Patient: Stephanie Weeks           Date of Birth: 12/24/1932           MRN: [id number] Visit Date: 07/19/2018              Requested by: No referring provider defined for this encounter. PCP: System, Pcp Not In   Assessment & Plan: Visit Diagnoses:  1. Closed fracture of left tibial plateau, initial encounter     Plan: X-rays reviewed with Dr. Madilyn Fireman.  Okay to start 50% partial weightbearing left lower extremity and knee immobilizer x1 week and then start weightbearing as tolerated in the immobilizer x1 more week and then can wean out of the immobilizer as comfort allows.  will use her walker.  We will continue to maintain range of motion of the knee and can also begin doing straight leg raise quad strengthening exercises.  Nothing too aggressive.  Follow-up with me in 6 weeks for recheck.  Return sooner if needed.  Follow-Up Instructions: Return in about 6 weeks (around 08/30/2018) for with Myliyah Weeks. .   Orders:  Orders Placed This Encounter  Procedures  . XR Knee 1-2 Views Left   No orders of the defined types were placed in this encounter.     Procedures: No procedures performed   Clinical Data: No additional findings.   Subjective: Chief Complaint  Patient presents with  . Left Knee - Fracture, Follow-up    DOI 05/17/18    HPI 84 year old white female with a history of left left medial tibial plateau fracture returns for recheck.  Date of injury October 2019.  She continues to have some pain and swelling but improving.  She is still nonweightbearing.  She has not been in a knee immobilizer since the facility lost it. Review of Systems No current cardiac pulmonary GI GU issues  Objective: Vital Signs: BP 138/83   Pulse 83   Ht 5\' 6"  (1.676 m)   Wt 140 lb (63.5 kg)   BMI 22.60 kg/m   Physical Exam Patient does have some swelling of the left knee although not extreme.  Range of motion about 0-1 110+ degrees.  Medial greater than  lateral joint line tenderness.  Calf Nontender.  Neuro vas intact. Ortho Exam  Specialty Comments:  No specialty comments available.  Imaging: No results found.   PMFS History: Patient Active Problem List   Diagnosis Date Noted  . Vitamin D deficiency 11/01/2013  . Encounter for long-term (current) use of other medications 11/01/2013  . Rt. Corneal scarring with Blindness 11/01/2013  . 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 11/01/2013  . Hyperlipidemia   . Hypertension   . Pre-diabetes   . DJD (degenerative joint disease)   . Hypothyroid    Past Medical History:  Diagnosis Date  . Dementia (HCC)   . DJD (degenerative joint disease)   . Hyperlipidemia   . Hypertension   . Hypothyroid   . Pre-diabetes     Family History  Problem Relation Age of Onset  . Hypertension Mother   . Stroke Mother   . Heart disease Father   . Heart attack Father   . Heart disease Brother   . Hypertension Brother   . Hypertension Brother   . Hypertension Sister   . Hypertension Sister   . Cancer Sister        ovary  . Hypertension Daughter     Past Surgical History:  Procedure Laterality Date  . CARDIAC CATHETERIZATION  negative  . CORNEAL TRANSPLANT Left 1985  . CORNEAL TRANSPLANT Right 1991  . ENTEROCELE REPAIR  2001   posterior  . FLEXIBLE SIGMOIDOSCOPY  [id number]  . KNEE ARTHROSCOPY Right 1995  . LIPOMA EXCISION Left 2004   parascapular lipoma. benign.  Marland Kitchen. MYRINGOTOMY Left 1990  . RETINAL DETACHMENT SURGERY Right 2001  . TOTAL ABDOMINAL HYSTERECTOMY  1985   Social History   Occupational History  . Not on file  Tobacco Use  . Smoking status: Never Smoker  . Smokeless tobacco: Never Used  Substance and Sexual Activity  . Alcohol use: No  . Drug use: No  . Sexual activity: Not on file

## 2018-08-30 ENCOUNTER — Ambulatory Visit (INDEPENDENT_AMBULATORY_CARE_PROVIDER_SITE_OTHER): Payer: Medicare Other | Admitting: Orthopaedic Surgery

## 2018-09-08 ENCOUNTER — Emergency Department (HOSPITAL_COMMUNITY)
Admission: EM | Admit: 2018-09-08 | Discharge: 2018-09-10 | Disposition: A | Discharge status: Rehabilitation Facility | Payer: Medicare Other | Attending: Emergency Medicine | Admitting: Emergency Medicine

## 2018-09-08 ENCOUNTER — Emergency Department (HOSPITAL_COMMUNITY): Payer: Medicare Other

## 2018-09-08 ENCOUNTER — Other Ambulatory Visit: Payer: Self-pay

## 2018-09-08 DIAGNOSIS — S42412A Displaced simple supracondylar fracture without intercondylar fracture of left humerus, initial encounter for closed fracture: Secondary | ICD-10-CM | POA: Insufficient documentation

## 2018-09-08 DIAGNOSIS — I10 Essential (primary) hypertension: Secondary | ICD-10-CM | POA: Diagnosis not present

## 2018-09-08 DIAGNOSIS — F0390 Unspecified dementia without behavioral disturbance: Secondary | ICD-10-CM | POA: Diagnosis not present

## 2018-09-08 DIAGNOSIS — W050XXA Fall from non-moving wheelchair, initial encounter: Secondary | ICD-10-CM | POA: Insufficient documentation

## 2018-09-08 DIAGNOSIS — S0990XA Unspecified injury of head, initial encounter: Secondary | ICD-10-CM | POA: Diagnosis present

## 2018-09-08 DIAGNOSIS — Y999 Unspecified external cause status: Secondary | ICD-10-CM | POA: Insufficient documentation

## 2018-09-08 DIAGNOSIS — S0083XA Contusion of other part of head, initial encounter: Secondary | ICD-10-CM | POA: Insufficient documentation

## 2018-09-08 DIAGNOSIS — Z79899 Other long term (current) drug therapy: Secondary | ICD-10-CM | POA: Insufficient documentation

## 2018-09-08 DIAGNOSIS — Y92122 Bedroom in nursing home as the place of occurrence of the external cause: Secondary | ICD-10-CM | POA: Diagnosis not present

## 2018-09-08 DIAGNOSIS — S42212A Unspecified displaced fracture of surgical neck of left humerus, initial encounter for closed fracture: Secondary | ICD-10-CM | POA: Insufficient documentation

## 2018-09-08 DIAGNOSIS — Y939 Activity, unspecified: Secondary | ICD-10-CM | POA: Insufficient documentation

## 2018-09-08 DIAGNOSIS — E039 Hypothyroidism, unspecified: Secondary | ICD-10-CM | POA: Diagnosis not present

## 2018-09-08 DIAGNOSIS — W19XXXA Unspecified fall, initial encounter: Secondary | ICD-10-CM

## 2018-09-08 LAB — CBC WITH DIFFERENTIAL/PLATELET
ABS IMMATURE GRANULOCYTES: 0.04 10*3/uL (ref 0.00–0.07)
Basophils Absolute: 0 10*3/uL (ref 0.0–0.1)
Basophils Relative: 0 %
Eosinophils Absolute: 0.1 10*3/uL (ref 0.0–0.5)
Eosinophils Relative: 1 %
HCT: 43 % (ref 36.0–46.0)
Hemoglobin: 14.1 g/dL (ref 12.0–15.0)
Immature Granulocytes: 0 %
Lymphocytes Relative: 13 %
Lymphs Abs: 1.4 10*3/uL (ref 0.7–4.0)
MCH: 31 pg (ref 26.0–34.0)
MCHC: 32.8 g/dL (ref 30.0–36.0)
MCV: 94.5 fL (ref 80.0–100.0)
MONOS PCT: 9 %
Monocytes Absolute: 1 10*3/uL (ref 0.1–1.0)
NEUTROS ABS: 8.3 10*3/uL — AB (ref 1.7–7.7)
Neutrophils Relative %: 77 %
Platelets: 253 10*3/uL (ref 150–400)
RBC: 4.55 MIL/uL (ref 3.87–5.11)
RDW: 15.4 % (ref 11.5–15.5)
WBC: 10.8 10*3/uL — ABNORMAL HIGH (ref 4.0–10.5)
nRBC: 0 % (ref 0.0–0.2)

## 2018-09-08 MED ORDER — FENTANYL CITRATE (PF) 100 MCG/2ML IJ SOLN
50.0000 ug | Freq: Once | INTRAMUSCULAR | Status: AC
  Administered 2018-09-08: 50 ug via INTRAVENOUS
  Filled 2018-09-08: qty 2

## 2018-09-08 NOTE — ED Provider Notes (Signed)
[facility]-EMERGENCY DEPT Provider Note   CSN: [id number] Arrival date & time: 09/08/18  2104     History   Chief Complaint Chief Complaint  Patient presents with  . Fall    HPI Stephanie FreezeMargaret S Ratliff-Yates is a 85 y.o. female with a history of degenerative disc disease, dementia, HLD, HTN, hypothyroidism, and retinal detachment who [facility] with a chief complaint of fall.  The patient reports that she was attempting to transfer from her wheelchair to her bed when she fell forward out of the chair and landed on her left side.  She was unable to get up after the fall as she has a known left tibial plateau fracture.  She denies hitting her head, LOC, nausea, vomiting, or headache.  She does not take any blood thinners.  In the ER, she reports significant pain to the left elbow and left shoulder.  She was placed in a c-collar by EMS.  She denies chest pain, neck pain, back pain, shortness of breath, hip pain, or pain to the bilateral lower extremities.  Family reports that she was diagnosed with a UTI and has been taking Bactrim since January 27.  The patient reports continued urinary dribbling and frequency.  She also reports subjective fevers.  She denies hematuria, vaginal pain, bleeding, or discharge, nausea, vomiting, diarrhea, abdominal pain.   The history is provided by the patient. No language interpreter was used.    Past Medical History:  Diagnosis Date  . Dementia (HCC)   . DJD (degenerative joint disease)   . Hyperlipidemia   . Hypertension   . Hypothyroid   . Pre-diabetes     Patient Active Problem List   Diagnosis Date Noted  . Vitamin D deficiency 11/01/2013  . Encounter for long-term (current) use of other medications 11/01/2013  . Rt. Corneal scarring with Blindness 11/01/2013  . 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 11/01/2013  . Hyperlipidemia   . Hypertension   . Pre-diabetes   . DJD  (degenerative joint disease)   . Hypothyroid     Past Surgical History:  Procedure Laterality Date  . CARDIAC CATHETERIZATION     negative  . CORNEAL TRANSPLANT Left 1985  . CORNEAL TRANSPLANT Right 1991  . ENTEROCELE REPAIR  2001   posterior  . FLEXIBLE SIGMOIDOSCOPY  [id number]  . KNEE ARTHROSCOPY Right 1995  . LIPOMA EXCISION Left 2004   parascapular lipoma. benign.  Marland Kitchen. MYRINGOTOMY Left 1990  . RETINAL DETACHMENT SURGERY Right 2001  . TOTAL ABDOMINAL HYSTERECTOMY  1985     OB History   No obstetric history on file.      Home Medications    Prior to Admission medications   Medication Sig Start Date End Date Taking? Authorizing Provider  acetaminophen (TYLENOL) 500 MG tablet Take 500 mg by mouth every 4 (four) hours as needed for moderate pain.   Yes [provider]  alum & mag hydroxide-simeth (MINTOX) 200-200-20 MG/5ML suspension Take 30 mLs by mouth every 6 (six) hours as needed for indigestion or heartburn.   Yes [provider]  camphor-menthol Wynelle Fanny(SARNA) lotion Apply 1 application topically 2 (two) times daily as needed for itching.   Yes [provider]  divalproex (DEPAKOTE SPRINKLE) 125 MG capsule Take 125-250 mg by mouth 2 (two) times daily. Take 1 capsule (125 mg) in the morning & Take 2 capsules (250 mg) in the evening   Yes [provider]  enalapril (VASOTEC) 20 MG tablet TAKE 1  TABLET EVERY DAY FOR BLOOD PRESSURE Patient taking differently: Take 20 mg by mouth daily.  02/27/14  Yes Quentin Mulling, PA-C  ENSURE PLUS (ENSURE PLUS) LIQD Take 237 mLs by mouth daily.   Yes [provider]  guaiFENesin (ROBITUSSIN) 100 MG/5ML liquid Take 200 mg by mouth 4 (four) times daily as needed for cough.   Yes [provider]  levothyroxine (SYNTHROID, LEVOTHROID) 50 MCG tablet Take 1 tablet (50 mcg total) by mouth daily before breakfast. 06/06/14  Yes Lucky Cowboy, MD  loperamide (IMODIUM) 2 MG capsule Take 2 mg by mouth  daily as needed for diarrhea or loose stools.   Yes [provider]  LORazepam (ATIVAN) 0.5 MG tablet Take 0.5 mg by mouth 3 (three) times daily. 8 AM, 2 PM, 8 PM   Yes [provider]  magnesium hydroxide (MILK OF MAGNESIA) 400 MG/5ML suspension Take 30 mLs by mouth at bedtime as needed for mild constipation.   Yes [provider]  Melatonin 5 MG TABS Take 10 mg by mouth at bedtime.   Yes [provider]  Neomycin-Bacitracin-Polymyxin (NEOMYCIN-POLYMYXIN-BACITRACIN EX) Apply 1 application topically daily as needed (skin tears).   Yes [provider]  PARoxetine (PAXIL) 40 MG tablet Take 40 mg by mouth every morning.   Yes [provider]  polyethylene glycol (MIRALAX / GLYCOLAX) packet Take 17 g by mouth daily as needed for moderate constipation.   Yes [provider]  QUEtiapine (SEROQUEL) 25 MG tablet Take 12.5 mg by mouth at bedtime.   Yes [provider]  senna-docusate (SENOKOT-S) 8.6-50 MG tablet Take 2 tablets by mouth 2 (two) times daily.   Yes [provider]  sulfamethoxazole-trimethoprim (BACTRIM DS,SEPTRA DS) 800-160 MG tablet Take 1 tablet by mouth 2 (two) times daily.   Yes [provider]  acetaminophen-codeine (TYLENOL #3) 300-30 MG per tablet TAKE 1 TABLET BY MOUTH 4 TIMES A DAY AS NEEDED FOR PAIN Patient not taking: Reported on 05/17/2018 06/16/14   Quentin Mulling, PA-C  cetirizine (ZYRTEC) 10 MG tablet Take 1 tablet (10 mg total) by mouth daily. For allergies Patient not taking: Reported on 05/17/2018 08/07/13   Lucky Cowboy, MD  LORazepam (ATIVAN) 1 MG tablet TAKE 1/2 TO 1 TABLET 3 TIMES A DAY AS NEEDED Patient not taking: Reported on 05/17/2018 05/30/14   Quentin Mulling, PA-C  methocarbamol (ROBAXIN) 500 MG tablet Take 1 tablet (500 mg total) by mouth 2 (two) times daily. Patient not taking: Reported on 09/08/2018 05/17/18   Lorre Nick, MD    Family History Family History  Problem  Relation Age of Onset  . Hypertension Mother   . Stroke Mother   . Heart disease Father   . Heart attack Father   . Heart disease Brother   . Hypertension Brother   . Hypertension Brother   . Hypertension Sister   . Hypertension Sister   . Cancer Sister        ovary  . Hypertension Daughter     Social History Social History   Tobacco Use  . Smoking status: Never Smoker  . Smokeless tobacco: Never Used  Substance Use Topics  . Alcohol use: No  . Drug use: No     Allergies   Alprazolam; Citalopram; Prednisone; and Sulfa antibiotics   Review of Systems Review of Systems  Constitutional: Negative for activity change.  Respiratory: Negative for shortness of breath.   Cardiovascular: Negative for chest pain.  Gastrointestinal: Negative for abdominal pain, diarrhea, nausea and vomiting.  Genitourinary: Negative for dysuria.       Urinary dribbling and frequency  Musculoskeletal: Positive for arthralgias, joint swelling and myalgias. Negative for back pain, gait problem, neck pain and neck stiffness.  Skin: Negative for rash.  Allergic/Immunologic: Negative for immunocompromised state.  Neurological: Negative for dizziness, weakness, numbness and headaches.  Psychiatric/Behavioral: Negative for confusion.   Physical Exam Updated Vital Signs BP 102/65   Pulse 76   Temp 98.7 F (37.1 C) (Oral)   Resp 16   Ht 5\' 7"  (1.702 m)   Wt 65.8 kg   SpO2 97%   BMI 22.71 kg/m   Physical Exam Vitals signs and nursing note reviewed.  Constitutional:      General: She is not in acute distress. HENT:     Head: Normocephalic.  Eyes:     Conjunctiva/sclera: Conjunctivae normal.  Neck:     Musculoskeletal: Neck supple.  Cardiovascular:     Rate and Rhythm: Normal rate and regular rhythm.     Heart sounds: No murmur. No friction rub. No gallop.   Pulmonary:     Effort: Pulmonary effort is normal. No respiratory distress.     Comments: No tenderness to the bilateral  ribs. Chest:     Chest wall: No tenderness.  Abdominal:     General: There is no distension.     Palpations: Abdomen is soft. There is no mass.     Tenderness: There is no abdominal tenderness. There is no right CVA tenderness, left CVA tenderness, guarding or rebound.     Hernia: No hernia is present.  Musculoskeletal:     Comments: To palpation over the left proximal humerus.  She is also severe significantly tender to palpation over the left distal humerus.  There appears to be a "bulge over the anterior aspect of the left distal humerus.  Question biceps tendon rupture, however, the patient is unable to range of motion her elbow to allow for further assessment.  Range of motion of the left elbow is significantly decreased secondary to pain.  She is diffusely tender to palpation over the joint.  Full active and passive range of motion of the left wrist.  Sensation is intact and equal throughout the bilateral upper extremities.  Radial pulses are 2+ and symmetric.  Good capillary refill of the digits of the left hand.  No tenderness to the cervical, thoracic, or lumbar spinous processes or bilateral paraspinal muscles.  Bilateral hips are nontender to palpation.  No focal tenderness to the bilateral lower extremities.  Skin:    General: Skin is warm.     Findings: No rash.  Neurological:     Mental Status: She is alert.  Psychiatric:        Behavior: Behavior normal.      ED Treatments / Results  Labs (all labs ordered are listed, but only abnormal results are displayed) Labs Reviewed  URINALYSIS, ROUTINE W REFLEX MICROSCOPIC - Abnormal; Notable for the following components:      Result Value   APPearance CLOUDY (*)    Ketones, ur 5 (*)    All other components within normal limits  COMPREHENSIVE METABOLIC PANEL - Abnormal; Notable for the following components:   Glucose, Bld 120 (*)    Calcium 8.8 (*)    All other components within normal limits  CBC WITH DIFFERENTIAL/PLATELET -  Abnormal; Notable for the following components:   WBC 10.8 (*)    Neutro Abs 8.3 (*)    All other components within normal  limits    EKG None  Radiology Dg Chest 2 View  Result Date: 09/09/2018 CLINICAL DATA:  Pain and swelling of the left shoulder and elbow after fall from wheelchair. EXAM: CHEST - 2 VIEW COMPARISON:  Same day left shoulder radiographs FINDINGS: Acute left surgical neck fracture of the humerus is identified without significant appearing displacement on this AP view. Osteoarthritis of the AC joints bilaterally with high-riding humeral head on the right. Chronic rotator cuff tear might account for this appearance. Cardiomegaly with tortuous atherosclerotic aorta. Eventration of the left hemidiaphragm is noted. No acute pulmonary consolidation. No acute displaced rib fracture. No effusion or pneumothorax. IMPRESSION: 1. Acute left surgical neck fracture of the humerus without significant displacement. 2. Cardiomegaly with tortuous atherosclerotic aorta. 3. No acute pulmonary disease. Electronically Signed   By: Tollie Ethavid  Kwon M.D.   On: 09/09/2018 00:38   Dg Elbow 2 Views Left  Result Date: 09/09/2018 CLINICAL DATA:  Left elbow pain after fall from wheelchair. EXAM: LEFT ELBOW - 2 VIEW COMPARISON:  Earlier same day elbow radiographs FINDINGS: The bones are demineralized in appearance limiting fine bony detail. There is a volar angulated appearance of the humeral condyles relative to the humeral shaft on the lateral view with elevation of the anterior fat pad. Findings are suspicious for a supracondylar fracture of the humerus with an elbow joint effusion. No joint dislocation is identified. Osteoarthritis is otherwise noted of the elbow joint. IMPRESSION: Findings suspicious for a supracondylar fracture of the humerus with elbow joint effusion. No joint dislocation. Electronically Signed   By: Tollie Ethavid  Kwon M.D.   On: 09/09/2018 00:41   Dg Elbow Complete Left  Result Date:  09/08/2018 CLINICAL DATA:  Fall.  Left arm pain EXAM: LEFT ELBOW - COMPLETE 3+ VIEW COMPARISON:  None. FINDINGS: Limited study due to positioning. No definite fracture visualized. Cannot assess for joint effusion with lack of true lateral view. Degenerative changes in the left shoulder. IMPRESSION: Limited study.  No definite acute bony abnormality. Electronically Signed   By: Charlett NoseKevin  Dover M.D.   On: 09/08/2018 22:02   Ct Head Wo Contrast  Result Date: 09/08/2018 CLINICAL DATA:  Patient fell out of wheelchair. Hematoma to the forehead. No loss of consciousness. No blood thinners. EXAM: CT HEAD WITHOUT CONTRAST CT CERVICAL SPINE WITHOUT CONTRAST TECHNIQUE: Multidetector CT imaging of the head and cervical spine was performed following the standard protocol without intravenous contrast. Multiplanar CT image reconstructions of the cervical spine were also generated. COMPARISON:  CT head 07/30/2007 FINDINGS: CT HEAD FINDINGS Brain: Diffuse cerebral atrophy. No ventricular dilatation. Low-attenuation changes throughout the deep white matter consistent with small vessel ischemia. No definite progression since previous study. No mass-effect or midline shift. No abnormal extra-axial fluid collections. Gray-white matter junctions are distinct. Basal cisterns are not effaced. No acute intracranial hemorrhage. Vascular: Intracranial arterial calcifications are present. Skull: Calvarium appears intact. No acute depressed skull fractures. Sinuses/Orbits: Paranasal sinuses and mastoid air cells are clear. Postoperative changes on the right globe. Other: Mild subcutaneous soft tissue swelling over the forehead. CT CERVICAL SPINE FINDINGS Alignment: There is reversal of the usual cervical lordosis with slight anterior subluxations at C3 on C4 and C4 on C5 as well as retrolisthesis of C6 on C7. This is likely degenerative. C1-2 articulation appears intact. Skull base and vertebrae: Skull base appears intact. Mild anterior  wedging of C5 and C6 contributing to reversal of lordosis. No acute vertebral compression is suggested. No focal bone lesion or bone destruction. Benign-appearing sclerosis  at T3. Soft tissues and spinal canal: No prevertebral soft tissue swelling. No abnormal paraspinal soft tissue mass or infiltration. Disc levels: Degenerative changes throughout the cervical spine with disc space narrowing and hypertrophic changes on the endplates at multiple levels. Degenerative changes throughout the facet joints and at C1-2. Upper chest: Fibrosis in the lung apices. Vascular calcifications. Other: None. IMPRESSION: 1. No acute intracranial abnormalities. Chronic atrophy and small vessel ischemic changes. 2. Reversal of the usual cervical lordosis with slight anterior subluxations at multiple levels, likely degenerative. No acute displaced fractures identified in the cervical spine. Electronically Signed   By: Burman Nieves M.D.   On: 09/08/2018 23:43   Ct Cervical Spine Wo Contrast  Result Date: 09/08/2018 CLINICAL DATA:  Patient fell out of wheelchair. Hematoma to the forehead. No loss of consciousness. No blood thinners. EXAM: CT HEAD WITHOUT CONTRAST CT CERVICAL SPINE WITHOUT CONTRAST TECHNIQUE: Multidetector CT imaging of the head and cervical spine was performed following the standard protocol without intravenous contrast. Multiplanar CT image reconstructions of the cervical spine were also generated. COMPARISON:  CT head 07/30/2007 FINDINGS: CT HEAD FINDINGS Brain: Diffuse cerebral atrophy. No ventricular dilatation. Low-attenuation changes throughout the deep white matter consistent with small vessel ischemia. No definite progression since previous study. No mass-effect or midline shift. No abnormal extra-axial fluid collections. Gray-white matter junctions are distinct. Basal cisterns are not effaced. No acute intracranial hemorrhage. Vascular: Intracranial arterial calcifications are present. Skull: Calvarium  appears intact. No acute depressed skull fractures. Sinuses/Orbits: Paranasal sinuses and mastoid air cells are clear. Postoperative changes on the right globe. Other: Mild subcutaneous soft tissue swelling over the forehead. CT CERVICAL SPINE FINDINGS Alignment: There is reversal of the usual cervical lordosis with slight anterior subluxations at C3 on C4 and C4 on C5 as well as retrolisthesis of C6 on C7. This is likely degenerative. C1-2 articulation appears intact. Skull base and vertebrae: Skull base appears intact. Mild anterior wedging of C5 and C6 contributing to reversal of lordosis. No acute vertebral compression is suggested. No focal bone lesion or bone destruction. Benign-appearing sclerosis at T3. Soft tissues and spinal canal: No prevertebral soft tissue swelling. No abnormal paraspinal soft tissue mass or infiltration. Disc levels: Degenerative changes throughout the cervical spine with disc space narrowing and hypertrophic changes on the endplates at multiple levels. Degenerative changes throughout the facet joints and at C1-2. Upper chest: Fibrosis in the lung apices. Vascular calcifications. Other: None. IMPRESSION: 1. No acute intracranial abnormalities. Chronic atrophy and small vessel ischemic changes. 2. Reversal of the usual cervical lordosis with slight anterior subluxations at multiple levels, likely degenerative. No acute displaced fractures identified in the cervical spine. Electronically Signed   By: Burman Nieves M.D.   On: 09/08/2018 23:43   Dg Shoulder Left  Result Date: 09/08/2018 CLINICAL DATA:  Fall, left shoulder pain EXAM: LEFT SHOULDER - 2+ VIEW COMPARISON:  None. FINDINGS: There is a left humeral neck fracture. No subluxation or dislocation. Minimal displacement. Degenerative changes in the left AC and glenohumeral joints. IMPRESSION: Minimally displaced left humeral neck fracture. Electronically Signed   By: Charlett Nose M.D.   On: 09/08/2018 22:01     Procedures Procedures (including critical care time)  Medications Ordered in ED Medications  fentaNYL (SUBLIMAZE) injection 50 mcg (50 mcg Intravenous Given 09/08/18 2336)  fentaNYL (SUBLIMAZE) injection 50 mcg (50 mcg Intravenous Given 09/09/18 0147)  sodium chloride 0.9 % bolus 1,000 mL (0 mLs Intravenous Stopped 09/09/18 0703)  LORazepam (ATIVAN) tablet 0.5 mg (0.5  mg Oral Given 09/09/18 0412)  acetaminophen (TYLENOL) tablet 1,000 mg (1,000 mg Oral Given 09/09/18 0411)     Initial Impression / Assessment and Plan / ED Course  I have reviewed the triage vital signs and the nursing notes.  Pertinent labs & imaging results that were available during my care of the patient were reviewed by me and considered in my medical decision making (see chart for details).     85 year old female brought in by EMS from [facility] house with a history of degenerative disc disease, dementia, HLD, HTN, hypothyroidism, and retinal detachment.  She had a mechanical fall from her wheelchair and fell onto her left side.  The patient was seen and evaluated along with Dr. Madilyn Hook, attending physician.   Left shoulder x-ray with minimally displaced left humeral neck fracture.  X-ray of the left elbow is suspicious for a supracondylar fracture of the humerus with elbow joint effusion.  CT head and cervical spine are unremarkable. Consulted orthopedic surgery and spoke with Dr. Roda Shutters who recommended long-arm splint placement with a sling.  Urinalysis does not appear infectious.  Labs are notable for leukocytosis of 10.8 with neutrophil count of 8.3.  Labs are otherwise reassuring.  Chest x-ray is negative.  She was placed on nasal cannula by EMS, but has not been hypoxic since arrival in the ED.  On exam, she appears dehydrated.  Will order IV fluids.  Given the patient's new injuries along with her left tibial plateau fracture, she will require a social work consult to reassess her placement options.  Patient was  discussed admission of the patient with Dr. Toniann Fail, hospitalist.  She did have a temperature of 100.9, but no obvious source of infection.  She has been treated with Bactrim for UTI, but urine does not appear infectious.  Chest x-ray is unremarkable.  The patient is not been hypoxic.  Will defer admission at this time and plan for a.m. social work consult.  However, if admission is required, the hospitalist team can be reconsulted.  Patient care transferred to PA Caccavale at the end of my shift. Patient presentation, ED course, and plan of care discussed with review of all pertinent labs and imaging. Please see his/her note for further details regarding further ED course and disposition.   Final Clinical Impressions(s) / ED Diagnoses   Final diagnoses:  Fall, initial encounter  Closed supracondylar fracture of left humerus, initial encounter  Fx humeral neck, left, closed, initial encounter    ED Discharge Orders    None       Barkley Boards, PA-C 09/09/18 0713    Tilden Fossa, MD 09/12/18 [phone]

## 2018-09-08 NOTE — ED Notes (Signed)
EMS reports they gave of fentanyl

## 2018-09-08 NOTE — ED Triage Notes (Signed)
Per EMS: Pt is coming from [facility]. Pt reportedly fell out of her wheelchair and has pain and swelling in the left shoulder and elbow and has a hematoma on her forehead.  Pt had no LOC and is not on bloodthinners.

## 2018-09-08 NOTE — ED Notes (Signed)
[facility] Expected date:  Expected time:  Means of arrival:  Comments: 40F fall from sitting

## 2018-09-08 NOTE — ED Notes (Signed)
Bed: [facility]12 Expected date:  Expected time:  Means of arrival:  Comments: [facility] 

## 2018-09-09 ENCOUNTER — Emergency Department (HOSPITAL_COMMUNITY): Payer: Medicare Other

## 2018-09-09 LAB — COMPREHENSIVE METABOLIC PANEL
ALBUMIN: 3.7 g/dL (ref 3.5–5.0)
ALT: 9 U/L (ref 0–44)
AST: 24 U/L (ref 15–41)
Alkaline Phosphatase: 61 U/L (ref 38–126)
Anion gap: 11 (ref 5–15)
BUN: 15 mg/dL (ref 8–23)
CO2: 23 mmol/L (ref 22–32)
Calcium: 8.8 mg/dL — ABNORMAL LOW (ref 8.9–10.3)
Chloride: 101 mmol/L (ref 98–111)
Creatinine, Ser: 0.85 mg/dL (ref 0.44–1.00)
GFR calc Af Amer: >60 mL/min (ref >60)
GFR calc non Af Amer: >60 mL/min (ref >60)
GLUCOSE: 120 mg/dL — AB (ref 70–99)
Potassium: 4.2 mmol/L (ref 3.5–5.1)
Sodium: 135 mmol/L (ref 135–145)
Total Bilirubin: 0.5 mg/dL (ref 0.3–1.2)
Total Protein: 7.1 g/dL (ref 6.5–8.1)

## 2018-09-09 LAB — URINALYSIS, ROUTINE W REFLEX MICROSCOPIC
Bilirubin Urine: NEGATIVE
Glucose, UA: NEGATIVE mg/dL
Hgb urine dipstick: NEGATIVE
Ketones, ur: 5 mg/dL — AB
Leukocytes, UA: NEGATIVE
NITRITE: NEGATIVE
PH: 5 (ref 5.0–8.0)
Protein, ur: NEGATIVE mg/dL
Specific Gravity, Urine: 1.021 (ref 1.005–1.030)

## 2018-09-09 MED ORDER — ACETAMINOPHEN 500 MG PO TABS
ORAL_TABLET | ORAL | Status: AC
  Administered 2018-09-09: 1000 mg via ORAL
  Filled 2018-09-09: qty 1

## 2018-09-09 MED ORDER — LORAZEPAM 0.5 MG PO TABS: 0.5000 mg | ORAL_TABLET | Freq: Three times a day (TID) | ORAL | 0 refills | Status: AC

## 2018-09-09 MED ORDER — ACETAMINOPHEN-CODEINE #3 300-30 MG PO TABS: ORAL_TABLET | ORAL | 0 refills | Status: AC

## 2018-09-09 MED ORDER — ACETAMINOPHEN 500 MG PO TABS
1000.0000 mg | ORAL_TABLET | Freq: Once | ORAL | Status: AC
  Administered 2018-09-09: 1000 mg via ORAL
  Filled 2018-09-09: qty 2

## 2018-09-09 MED ORDER — HYDROCODONE-ACETAMINOPHEN 5-325 MG PO TABS
0.5000 | ORAL_TABLET | Freq: Once | ORAL | Status: AC | PRN
  Administered 2018-09-09: 0.5 via ORAL
  Filled 2018-09-09: qty 1

## 2018-09-09 MED ORDER — FENTANYL CITRATE (PF) 100 MCG/2ML IJ SOLN
50.0000 ug | Freq: Once | INTRAMUSCULAR | Status: AC
  Administered 2018-09-09: 50 ug via INTRAVENOUS
  Filled 2018-09-09: qty 2

## 2018-09-09 MED ORDER — HYDROCODONE-ACETAMINOPHEN 5-325 MG PO TABS: 0.5000 | ORAL_TABLET | Freq: Once | ORAL | Status: DC

## 2018-09-09 MED ORDER — SODIUM CHLORIDE 0.9 % IV BOLUS
1000.0000 mL | Freq: Once | INTRAVENOUS | Status: AC
  Administered 2018-09-09: 1000 mL via INTRAVENOUS

## 2018-09-09 MED ORDER — LORAZEPAM 0.5 MG PO TABS
0.5000 mg | ORAL_TABLET | Freq: Once | ORAL | Status: AC
  Administered 2018-09-09: 0.5 mg via ORAL
  Filled 2018-09-09: qty 1

## 2018-09-09 NOTE — ED Notes (Signed)
Physical Therapy has completed assessment of patient.

## 2018-09-09 NOTE — Progress Notes (Addendum)
FL-2 sent to [facility] via the hub.  CSW will update RN and EDP.  CSW will continue to follow for D/C needs.  [facility]. Varnika Butz, LCSW, LCAS, CSI Clinical Social Worker Ph: [phone]

## 2018-09-09 NOTE — ED Provider Notes (Signed)
  Physical Exam  BP 110/63 (BP Location: Right Arm)   Pulse 78   Temp 98.7 F (37.1 C) (Oral)   Resp 16   Ht 5\' 7"  (1.702 m)   Wt 65.8 kg   SpO2 97%   BMI 22.71 kg/m   Physical Exam  Gen: appears in NAD   ED Course/Procedures     Procedures  MDM   Patient signed out to me by Marigene Ehlers, PA-C.  Please see previous notes for further history.  In brief, patient with a history of a known left tibial plateau fracture.  Weightbearing around 50%.  Patient coming from [facility] after a fall onto her left side when trying to transfer from the wheelchair to the bed.  She was found to have a new humerus neck fracture and suspected supracondylar fracture.  Orthopedics consulted, patient placed in a splint.  Boarding overnight for social work consult, as patient is in assisted living and does not have help at home.  Social work evaluated the patient.  Unfortunately, there may be barriers to placement including insurance and cost of private pay.  Patient is in a manual wheelchair, I suspect she will not be able to care for herself at home. Will have PT evaluate.   PT evaluated the patient, recommending skilled nursing placement.  Social work aware, and assisting with placement.  Patient's insurance needs to be changed prior to placement.  Patient continues to board.  Pain is controlled with Tylenol, using 0.5 Norco tablet as needed for breakthrough pain.  Discussed with social work, who believe pt will be able to be accepted to a SNF today. If not, plan for d/c with daughter and f/u with PCP for placement.   Oncoming PA made aware.        Alveria Apley, PA-C 09/09/18 1526    Devoria Albe, MD 09/10/18 [phone]

## 2018-09-09 NOTE — ED Notes (Signed)
ED PA at bedside with patient and family.

## 2018-09-09 NOTE — Progress Notes (Addendum)
CSW aware of consult as patient is from [facility]F and had a fall when she was transferring from her wheelchair to bed. Per notes, patient may have a fracture and may need a higher level of care than an ALF can provide. Per notes, EDPA spoke with Hospitalist last night who did not feel admission was appropriate at the time. CSW spoke with patient at bedside regarding options as patient has Medicare Part A and Part B but does not have 3-night inpatient qualifying stay required for insurance to pay for short-term rehab. CSW inquired about if patient could pay privately to which patient stated she did not have that kind of money. Patient requested that CSW reach out to her daughter regarding options. CSW to follow up with patient's daughter, [name]. CSW spoke with EDPA who stated she is putting in a consult for PT.  CSW spoke with patient's daughter, [name], and explained barriers to placement. [name] expressed understanding and informed CSW that patient already gets PT 3 days a week at the ALF. [name] understands patient may not be admitted and agrees to consider other options for patient. Patient not yet medically cleared. CSW will continue to follow.  2:23pm- CSW aware patient was seen by PT and is being recommended for SNF. CSW spoke with family at bedside numerous times regarding the various options for patient at this time. Per family, patient's sister who patient may have been able to stay with is not ready to take her in at this time. CSW aware patient has Special Assistance Medicaid but that will only pay for an ALF. CSW spoke with Babette Relic and Revonda Standard in Admissions with [facility] and [facility] who stated process to transition from Special Assistance Medicaid to Long Term Medicaid is not too complicated of a process and is agreeable to come meet with family to start this process. CSW spoke with patient's family at bedside who are willing to meet with facility. CSW to await meeting between  [facility] and family. CSW will continue to follow.  Archie Balboa, LCSWA  Clinical Social Work Department  [employer]  [phone]

## 2018-09-09 NOTE — ED Notes (Signed)
Physical Therapy at bedside with patient.

## 2018-09-09 NOTE — Progress Notes (Signed)
CSW received a call from [place] at [facility] that pt would need hard-copies of prescriptions (2), and an updated FL-2 denoting pt's diagnoses describing pt's two fractures.  EDP will sign the updated FL-2 and CSW will send to [facility] via the hub.    EPD will provide the hard copies of pt's prescriptions which will be sent with the pt to [facility] at D/C.  CSW will continue to follow for D/C needs.  Stephanie Weeks. Amery Vandenbos, LCSW, LCAS, CSI Clinical Social Worker Ph: [phone]

## 2018-09-09 NOTE — Evaluation (Addendum)
Physical Therapy Evaluation Patient Details Name: Stephanie CatenaMargaret S Ratliff-Yates MRN: [id number] DOB: 1933/05/06 Today's Date: 09/09/2018   History of Present Illness  85 yo female admitted to ED on 1/30 after fall where pt fractured L humeral neck and potentially supracondylar region of humerus. CT of C-spine and head unremarkable. Pt's fall occurred when attempting to transfer wheelchair to bed. 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   Clinical Impression   Pt presents with LUE and shoulder pain, LE weakness, anxiety with mobility, difficulty performing bed mobility and transfers to w/c. Pt to benefit from acute PT to address deficits. Pt requiring mod assist +2 for safety for transfer to and from wheelchair, and pt very anxious for the entirety of mobility attempt. PT recommending ST-SNF to increased independence with mobility and return to PLOF. PT to progress mobility as tolerated, and will continue to follow acutely.      Follow Up Recommendations SNF    Equipment Recommendations  None recommended by PT    Recommendations for Other Services       Precautions / Restrictions Precautions Precautions: Fall Required Braces or Orthoses: Splint/Cast Splint/Cast: Splint and sling on LUE Splint/Cast - Date Prophylactic Dressing Applied (if applicable): 09/09/18 Restrictions Weight Bearing Restrictions: Yes LUE Weight Bearing: Non weight bearing Other Position/Activity Restrictions: No WB precautions listed, assumed NWB. Pt and family state LLE WBAT at this point.       Mobility  Bed Mobility Overal bed mobility: Needs Assistance Bed Mobility: Supine to Sit;Sit to Supine     Supine to sit: Min assist;HOB elevated Sit to supine: HOB elevated;+2 for physical assistance;Min assist   General bed mobility comments: Min assist for supine to sit for trunk  elevation, LE lifting and translation to EOB, and scooting to EOB with use of bed pad. Min assist for sit to supine for LE lifting, pt with increased effort with trunk lowering. PT and PT aide assisted pt in scooting up in bed with bed pad.   Transfers Overall transfer level: Needs assistance Equipment used: Rolling walker (2 wheeled);None Transfers: Squat Pivot Transfers Sit to Stand: Mod assist;From elevated surface   Squat pivot transfers: Mod assist;+2 safety/equipment;From elevated surface     General transfer comment: Attempted sit to stand with use of power up from LEs and RUE, pt requiring min assist for power up and steadying but could not maintain this due to feeling weak and dizzy. BP sitting 119/69, BP post-standing 109/79. After this attempt, pt required mod assist to transfer to wheelchair at bedside with squat pivot. Assist for power up, LE guarding, guiding pt to wheelchair. Pt transferring towards R (strong, dominant side). Mod assist for transfer back to bed towards L.  Pt very anxious.   Ambulation/Gait Ambulation/Gait assistance: (NT - pt unsteady with transfer)              Stairs            Wheelchair Mobility    Modified Rankin (Stroke Patients Only)       Balance Overall balance assessment: Needs assistance Sitting-balance support: Single extremity supported Sitting balance-Leahy Scale: Fair Sitting balance - Comments: posterior leaning with LE movement without UE support Postural control: Posterior lean   Standing balance-Leahy Scale: Zero Standing balance comment: relies on PT and PT aide for support in upright  Pertinent Vitals/Pain Pain Assessment: 0-10 Pain Score: 5  Pain Location: L UE and shoulder  Pain Descriptors / Indicators: Sore;Aching Pain Intervention(s): Limited activity within patient's tolerance;Monitored during session    Home Living Family/patient expects to be discharged to::  Assisted living               Home Equipment: Walker - 2 wheels Additional Comments: Pt borrowing a w/c from facility, and states the lock on the w/c is broken and may have led to her fall.     Prior Function Level of Independence: Needs assistance   Gait / Transfers Assistance Needed: Pt and pt's family at bedside report pt was independent with transfers to and from w/c PTA. Pt has been using w/c for all mobility since October, when pt fractured her L tibial plateau due to fall. Pt has been working with PT on short-distance ambulation with RW, but walking is very limited.  ADL's / Homemaking Assistance Needed: Pt reports needing some assist with bathing, but pt dresses self         Hand Dominance   Dominant Hand: Right    Extremity/Trunk Assessment        Lower Extremity Assessment Lower Extremity Assessment: Generalized weakness(Pt able to perform AROM hip flexion/extension and knee flexion/extension in supine. Pt able to perform 3/5 knee extension in sitting, PF/DF)    Cervical / Trunk Assessment Cervical / Trunk Assessment: Normal  Communication   Communication: No difficulties  Cognition Arousal/Alertness: Awake/alert Behavior During Therapy: Anxious Overall Cognitive Status: Within Functional Limits for tasks assessed                                        General Comments      Exercises     Assessment/Plan    PT Assessment Patient needs continued PT services  PT Problem List Decreased strength;Pain;Decreased activity tolerance;Decreased knowledge of use of DME;Decreased balance;Decreased mobility;Decreased knowledge of precautions;Decreased safety awareness       PT Treatment Interventions DME instruction;Therapeutic activities;Gait training;Therapeutic exercise;Patient/family education;Stair training;Balance training;Functional mobility training    PT Goals (Current goals can be found in the Care Plan section)  Acute Rehab PT  Goals Patient Stated Goal: progress mobility, back to indep with transfers  PT Goal Formulation: With patient Time For Goal Achievement: 09/16/18 Potential to Achieve Goals: Fair    Frequency Min 2X/week   Barriers to discharge        Co-evaluation               AM-PAC PT "6 Clicks" Mobility  Outcome Measure Help needed turning from your back to your side while in a flat bed without using bedrails?: A Little Help needed moving from lying on your back to sitting on the side of a flat bed without using bedrails?: A Little Help needed moving to and from a bed to a chair (including a wheelchair)?: A Lot Help needed standing up from a chair using your arms (e.g., wheelchair or bedside chair)?: A Lot Help needed to walk in hospital room?: Total Help needed climbing 3-5 steps with a railing? : Total 6 Click Score: 12    End of Session Equipment Utilized During Treatment: Gait belt Activity Tolerance: Other (comment);Patient limited by pain(pt limited by anxiety) Patient left: in bed;with bed alarm set;with call bell/phone within reach;with family/visitor present Nurse Communication: Mobility status PT Visit Diagnosis: Other abnormalities of gait and  mobility (R26.89);Muscle weakness (generalized) (M62.81)    Time: 5868-2574 PT Time Calculation (min) (ACUTE ONLY): 26 min   Charges:   PT Evaluation $PT Eval Low Complexity: 1 Low          Nicola Police, PT Acute Rehabilitation Services Pager [phone]  Office [phone]  Jaeleah Smyser D Rinoa Garramone 09/09/2018, 1:39 PM

## 2018-09-09 NOTE — Discharge Instructions (Addendum)
The x-rays all show that you have a fracture of your left arm. Please follow-up with Dr. Ophelia Charter, orthopedic surgery by calling and setting up an appointment.

## 2018-09-09 NOTE — ED Notes (Signed)
Called .Ortho Tech @0127  for long arm splint

## 2018-09-09 NOTE — NC FL2 (Addendum)
[place] MEDICAID FL2 LEVEL OF CARE SCREENING TOOL     IDENTIFICATION  Patient Name: Stephanie Weeks Birthdate: Nov 17, 1932 Sex: female Admission Date (Current Location): 09/08/2018  [facility] and [place] Number:  Producer, television/film/video and Address:  [facility],  [address], [place] [address]      Provider Number: [id number]  Attending Physician Name and Address:  No att. providers found  Relative Name and Phone Number:  [name] [phone]    Current Level of Care: Hospital Recommended Level of Care: Skilled Nursing Facility Prior Approval Number:    Date Approved/Denied:   PASRR Number: [id number]  Discharge Plan: SNF    Current Diagnoses: Patient Active Problem List   Diagnosis Date Noted  . Vitamin D deficiency 11/01/2013  . Encounter for long-term (current) use of other medications 11/01/2013  . Rt. Corneal scarring with Blindness 11/01/2013  . 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 11/01/2013  . Hyperlipidemia   . Hypertension   . Pre-diabetes   . DJD (degenerative joint disease)   . Hypothyroid   - Left-sided Closed Humeral Neck Fracture  Date noted:09/09/2018 - Left Closed Supracondylar Fracture            Date noted:09/09/2018  Orientation RESPIRATION BLADDER Height & Weight     Self, Situation, Time, Place  Normal Continent Weight: 145 lb (65.8 kg) Height:  5\' 7"  (170.2 cm)  BEHAVIORAL SYMPTOMS/MOOD NEUROLOGICAL BOWEL NUTRITION STATUS      Continent Diet  AMBULATORY STATUS COMMUNICATION OF NEEDS Skin   Limited Assist Verbally Normal                       Personal Care Assistance Level of Assistance  Bathing, Feeding, Dressing Bathing Assistance: Limited assistance Feeding assistance: Independent Dressing Assistance: Limited assistance     Functional Limitations Info             SPECIAL CARE FACTORS FREQUENCY                       Contractures      Additional Factors Info  Code Status  Full Code              Current Medications (09/09/2018):  This is the current hospital active medication list No current facility-administered medications for this encounter.    Current Outpatient Medications  Medication Sig Dispense Refill  . acetaminophen (TYLENOL) 500 MG tablet Take 500 mg by mouth every 4 (four) hours as needed for moderate pain.    Marland Kitchen alum & mag hydroxide-simeth (MINTOX) 200-200-20 MG/5ML suspension Take 30 mLs by mouth every 6 (six) hours as needed for indigestion or heartburn.    . camphor-menthol (SARNA) lotion Apply 1 application topically 2 (two) times daily as needed for itching.    . divalproex (DEPAKOTE SPRINKLE) 125 MG capsule Take 125-250 mg by mouth 2 (two) times daily. Take 1 capsule (125 mg) in the morning & Take 2 capsules (250 mg) in the evening    . enalapril (VASOTEC) 20 MG tablet TAKE 1 TABLET EVERY DAY FOR BLOOD PRESSURE (Patient taking differently: Take 20 mg by mouth daily. ) 90 tablet 1  . ENSURE PLUS (ENSURE PLUS) LIQD Take 237 mLs by mouth daily.    Marland Kitchen guaiFENesin (ROBITUSSIN) 100 MG/5ML liquid Take 200 mg by mouth 4 (four) times daily as needed for cough.    . levothyroxine (SYNTHROID, LEVOTHROID) 50 MCG tablet Take 1 tablet (50 mcg total) by mouth daily before breakfast. 90  tablet 1  . loperamide (IMODIUM) 2 MG capsule Take 2 mg by mouth daily as needed for diarrhea or loose stools.    Marland Kitchen LORazepam (ATIVAN) 0.5 MG tablet Take 0.5 mg by mouth 3 (three) times daily. 8 AM, 2 PM, 8 PM    . magnesium hydroxide (MILK OF MAGNESIA) 400 MG/5ML suspension Take 30 mLs by mouth at bedtime as needed for mild constipation.    . Melatonin 5 MG TABS Take 10 mg by mouth at bedtime.    Marland Kitchen Neomycin-Bacitracin-Polymyxin (NEOMYCIN-POLYMYXIN-BACITRACIN EX) Apply 1 application topically daily as needed (skin tears).    Marland Kitchen PARoxetine (PAXIL) 40 MG tablet Take 40 mg by mouth every morning.    . polyethylene glycol (MIRALAX / GLYCOLAX) packet Take 17 g by mouth daily as needed for moderate  constipation.    . QUEtiapine (SEROQUEL) 25 MG tablet Take 12.5 mg by mouth at bedtime.    . senna-docusate (SENOKOT-S) 8.6-50 MG tablet Take 2 tablets by mouth 2 (two) times daily.    Marland Kitchen sulfamethoxazole-trimethoprim (BACTRIM DS,SEPTRA DS) 800-160 MG tablet Take 1 tablet by mouth 2 (two) times daily.    Marland Kitchen acetaminophen-codeine (TYLENOL #3) 300-30 MG per tablet TAKE 1 TABLET BY MOUTH 4 TIMES A DAY AS NEEDED FOR PAIN (Patient not taking: Reported on 05/17/2018) 120 tablet 0  . cetirizine (ZYRTEC) 10 MG tablet Take 1 tablet (10 mg total) by mouth daily. For allergies (Patient not taking: Reported on 05/17/2018) 90 tablet 99  . LORazepam (ATIVAN) 1 MG tablet TAKE 1/2 TO 1 TABLET 3 TIMES A DAY AS NEEDED (Patient not taking: Reported on 05/17/2018) 90 tablet 1  . methocarbamol (ROBAXIN) 500 MG tablet Take 1 tablet (500 mg total) by mouth 2 (two) times daily. (Patient not taking: Reported on 09/08/2018) 20 tablet 0     Discharge Medications: Please see discharge summary for a list of discharge medications.  Relevant Imaging Results:  Relevant Lab Results:   Additional Information SSN: [id number]  Archie Balboa, [place]

## 2018-09-09 NOTE — Progress Notes (Signed)
Consult request has been received. CSW attempting to follow up at present time.  Per Revonda StandardAllison, the pt's [facility] has [facility] to sign the pt in, but the pt can come when [facility] is able to transport.  Only the [facility].  CS[place] a [place] from [place] [facility]: [facility] Number for report is: [phone] Pt's unit/room/bed number will be: 112B Accepting physician: SNF MD  Pt can arrive ASAP on 09/09/2018  CSW will update RN and EDP.  Dorothe PeaJonathan F. Leasa Kincannon, LCSW, LCAS, CSI Clinical Social Worker Ph: [phone]     [phone]

## 2018-09-10 NOTE — ED Notes (Signed)
[facility] called stating no one gave them report. This Clinical research associate verbalized spoke with Frutoso Chase from [place] just prior to transport to facility and this Clinical research associate told [employer] had been set up by previous shift and gave her opportunity to ask any questions; none asked.   Per staff on phone at present F-2 never received; this writer confirmed and verbalized with staff on phone that F-2 was documented 5:34 PM 09/09/2018. While on phone staff given opportunity to ask questions.   5:50 AM 09/10/2018

## 2018-09-10 NOTE — ED Notes (Addendum)
Per previous nurse pt awaiting [facility]. This Clinical research associate called [facility] to confirm pt was on list to be transported to facility related to extended wait time; per [facility] pt not on list to be transported to facility; added at present. This Clinical research associate attempted to call [employer] to update; no answer.Pt sleeping at present.

## 2018-09-10 NOTE — ED Notes (Addendum)
This Clinical research associate spoke with Stephanie Weeks at [facility] to alert her that transport is here to transport pt to facility.

## 2018-09-14 ENCOUNTER — Ambulatory Visit (INDEPENDENT_AMBULATORY_CARE_PROVIDER_SITE_OTHER): Payer: Medicare Other | Admitting: Orthopaedic Surgery

## 2018-09-14 ENCOUNTER — Encounter (INDEPENDENT_AMBULATORY_CARE_PROVIDER_SITE_OTHER): Payer: Self-pay | Admitting: Orthopaedic Surgery

## 2018-09-14 VITALS — BP 110/71 | HR 99 | Ht 67.0 in | Wt 145.0 lb

## 2018-09-14 DIAGNOSIS — S42295A Other nondisplaced fracture of upper end of left humerus, initial encounter for closed fracture: Secondary | ICD-10-CM | POA: Diagnosis not present

## 2018-09-14 NOTE — Progress Notes (Signed)
Office Visit Note   Patient: Stephanie Weeks [place]           Date of Birth: October 15, 1932           MRN: [id number] Visit Date: 09/14/2018              Requested by: No referring provider defined for this encounter. PCP: System, Pcp Not In   Assessment & Plan: Visit Diagnoses:  1. Other closed nondisplaced fracture of proximal end of left humerus, initial encounter     Plan: X-rays reviewed the distal humerus has questionable fracture but she did not have an elbow effusion and absence of hemarthrosis suggest probably some mild arthritis.  She has osteopenia.  We will place her in a shoulder immobilizer rather than simple sling.  Return visit we will repeat x-rays left proximal humerus remove the splint from her left elbow and repeat elbow x-rays AP and lateral.  Elbow x-ray will be made attempted to not rotate the shoulder for the AP view.  Follow-Up Instructions: Return in about 2 weeks (around 09/28/2018).   Orders:  No orders of the defined types were placed in this encounter.  No orders of the defined types were placed in this encounter.     Procedures: No procedures performed   Clinical Data: No additional findings.   Subjective: Chief Complaint  Patient presents with  . Left Shoulder - Fracture    HPI 85 year old female was at her residence leaned over sideways and somehow the wheelchair tipped over she relates.  Records from the ER list that she fell out of the wheelchair.  She suffered a nondisplaced left proximal humerus fracture.  Left tibial plateau fracture and she denies any pain in her knee currently.  Review of Systems updated unchanged from October 2019 office visit   Objective: Vital Signs: BP 110/71   Pulse 99   Ht 5\' 7"  (1.702 m)   Wt 145 lb (65.8 kg)   BMI 22.71 kg/m   Physical Exam Constitutional:      Appearance: She is well-developed.  HENT:     Head: Normocephalic.     Right Ear: External ear normal.     Left Ear: External ear  normal.  Eyes:     Pupils: Pupils are equal, round, and reactive to light.  Neck:     Thyroid: No thyromegaly.     Trachea: No tracheal deviation.  Cardiovascular:     Rate and Rhythm: Normal rate.  Pulmonary:     Effort: Pulmonary effort is normal.  Abdominal:     Palpations: Abdomen is soft.  Skin:    General: Skin is warm and dry.  Neurological:     Mental Status: She is alert and oriented to person, place, and time.  Psychiatric:        Behavior: Behavior normal.     Ortho Exam ecchymosis left distal upper arm.  She is in a posterior splint for suspected elbow fracture and has tenderness proximally over the humerus.  She is in a simple sling.  Mild swelling of the hand.  Specialty Comments:  No specialty comments available.  Imaging: CLINICAL DATA:  Fall, left shoulder pain  EXAM: LEFT SHOULDER - 2+ VIEW  COMPARISON:  None.  FINDINGS: There is a left humeral neck fracture. No subluxation or dislocation. Minimal displacement. Degenerative changes in the left AC and glenohumeral joints.  IMPRESSION: Minimally displaced left humeral neck fracture.   Electronically Signed   By: Charlett NoseKevin  Dover M.D.  On: 09/08/2018 22:   PMFS History: Patient Active Problem List   Diagnosis Date Noted  . Vitamin D deficiency 11/01/2013  . Encounter for long-term (current) use of other medications 11/01/2013  . Rt. Corneal scarring with Blindness 11/01/2013  . 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 11/01/2013  . Hyperlipidemia   . Hypertension   . Pre-diabetes   . DJD (degenerative joint disease)   . Hypothyroid    Past Medical History:  Diagnosis Date  . Dementia (HCC)   . DJD (degenerative joint disease)   . Hyperlipidemia   . Hypertension   . Hypothyroid   . Pre-diabetes     Family History  Problem Relation Age of Onset  . Hypertension Mother   . Stroke Mother   . Heart disease Father   . Heart attack Father   . Heart disease Brother   . Hypertension Brother     . Hypertension Brother   . Hypertension Sister   . Hypertension Sister   . Cancer Sister        ovary  . Hypertension Daughter     Past Surgical History:  Procedure Laterality Date  . CARDIAC CATHETERIZATION     negative  . CORNEAL TRANSPLANT Left 1985  . CORNEAL TRANSPLANT Right 1991  . ENTEROCELE REPAIR  2001   posterior  . FLEXIBLE SIGMOIDOSCOPY  [id number]  . KNEE ARTHROSCOPY Right 1995  . LIPOMA EXCISION Left 2004   parascapular lipoma. benign.  Marland Kitchen MYRINGOTOMY Left 1990  . RETINAL DETACHMENT SURGERY Right 2001  . TOTAL ABDOMINAL HYSTERECTOMY  1985   Social History   Occupational History  . Not on file  Tobacco Use  . Smoking status: Never Smoker  . Smokeless tobacco: Never Used  Substance and Sexual Activity  . Alcohol use: No  . Drug use: No  . Sexual activity: Not on file

## 2018-09-30 ENCOUNTER — Ambulatory Visit (INDEPENDENT_AMBULATORY_CARE_PROVIDER_SITE_OTHER): Payer: Medicare Other

## 2018-09-30 ENCOUNTER — Ambulatory Visit (INDEPENDENT_AMBULATORY_CARE_PROVIDER_SITE_OTHER): Payer: Medicare Other | Admitting: Orthopaedic Surgery

## 2018-09-30 ENCOUNTER — Encounter (INDEPENDENT_AMBULATORY_CARE_PROVIDER_SITE_OTHER): Payer: Self-pay | Admitting: Orthopaedic Surgery

## 2018-09-30 VITALS — BP 123/81 | HR 105 | Ht 67.0 in | Wt 145.0 lb

## 2018-09-30 DIAGNOSIS — S42295A Other nondisplaced fracture of upper end of left humerus, initial encounter for closed fracture: Secondary | ICD-10-CM | POA: Diagnosis not present

## 2018-09-30 DIAGNOSIS — M25522 Pain in left elbow: Secondary | ICD-10-CM

## 2018-09-30 DIAGNOSIS — S82142A Displaced bicondylar fracture of left tibia, initial encounter for closed fracture: Secondary | ICD-10-CM

## 2018-09-30 NOTE — Progress Notes (Signed)
Post-Op Visit Note   Patient: Stephanie Weeks           Date of Birth: 01/27/1933           MRN: [id number] Visit Date: 09/30/2018 PCP: System, Pcp Not In   Assessment & Plan: Post initial fall 05/17/2018 with medial tibial plateau fracture left.  Later fall with left proximal humerus fracture on 09/08/2018 as well as left distal supracondylar humerus fracture with slight angulation without displacement.  Tibia fracture is healed she can weight-bear as tolerated using a cane in her right hand with therapy.  Recheck 3 weeks for repeat x-rays left elbow and left proximal humerus and we should be able to start some therapy on her shoulder and elbow at that time.  Until she returns she will continue with the posterior splint and shoulder immobilizer.  Chief Complaint:  Chief Complaint  Patient presents with  . Left Shoulder - Fracture, Follow-up    DOI 09/08/2018  . Left Knee - Fracture, Follow-up    DOI 05/17/18   Visit Diagnoses:  1. Other closed nondisplaced fracture of proximal end of left humerus, initial encounter   2. Closed fracture of left tibial plateau, initial encounter   3. Pain in left elbow     Plan: Return 3 weeks with AP lateral left elbow and 2 view x-rays left proximal humerus fracture.  Follow-Up Instructions: No follow-ups on file.   Orders:  Orders Placed This Encounter  Procedures  . XR Shoulder Left  . XR Knee 1-2 Views Left  . XR Elbow 2 Views Left   No orders of the defined types were placed in this encounter.   Imaging: Xr Elbow 2 Views Left  Result Date: 09/30/2018 Multiple views left elbow obtained AP lateral and oblique.  This shows supracondylar fracture without displacement slight angulation.  Significant osteopenia noted. Impression: Supracondylar humerus fracture in satisfactory unchanged position from 09/08/2018 images.  Xr Knee 1-2 Views Left  Result Date: 09/30/2018 2 view x-rays left knee obtained and reviewed.  This shows medial  tibial plateau fracture with interval healing no further depression. Impression healed left medial tibial plateau fracture  Xr Shoulder Left  Result Date: 09/30/2018 2 view x-rays left humerus obtained and reviewed this shows impacted proximal humerus fracture with some callus formation.  Glenohumeral joint is reduced. Impression: Left proximal humerus fracture with significant impaction.   PMFS History: Patient Active Problem List   Diagnosis Date Noted  . Vitamin D deficiency 11/01/2013  . Encounter for long-term (current) use of other medications 11/01/2013  . Rt. Corneal scarring with Blindness 11/01/2013  . 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 11/01/2013  . Hyperlipidemia   . Hypertension   . Pre-diabetes   . DJD (degenerative joint disease)   . Hypothyroid    Past Medical History:  Diagnosis Date  . Dementia (HCC)   . DJD (degenerative joint disease)   . Hyperlipidemia   . Hypertension   . Hypothyroid   . Pre-diabetes     Family History  Problem Relation Age of Onset  . Hypertension Mother   . Stroke Mother   . Heart disease Father   . Heart attack Father   . Heart disease Brother   . Hypertension Brother   . Hypertension Brother   . Hypertension Sister   . Hypertension Sister   . Cancer Sister        ovary  . Hypertension Daughter     Past Surgical History:  Procedure Laterality Date  . CARDIAC  CATHETERIZATION     negative  . CORNEAL TRANSPLANT Left 1985  . CORNEAL TRANSPLANT Right 1991  . ENTEROCELE REPAIR  2001   posterior  . FLEXIBLE SIGMOIDOSCOPY  [id number]  . KNEE ARTHROSCOPY Right 1995  . LIPOMA EXCISION Left 2004   parascapular lipoma. benign.  Marland Kitchen MYRINGOTOMY Left 1990  . RETINAL DETACHMENT SURGERY Right 2001  . TOTAL ABDOMINAL HYSTERECTOMY  1985   Social History   Occupational History  . Not on file  Tobacco Use  . Smoking status: Never Smoker  . Smokeless tobacco: Never Used  Substance and Sexual Activity  . Alcohol use: No  . Drug use: No   . Sexual activity: Not on file

## 2018-10-28 ENCOUNTER — Ambulatory Visit (INDEPENDENT_AMBULATORY_CARE_PROVIDER_SITE_OTHER): Payer: Medicare Other | Admitting: Orthopaedic Surgery

## 2019-06-26 ENCOUNTER — Emergency Department (HOSPITAL_COMMUNITY): Payer: Medicare Other

## 2019-06-26 ENCOUNTER — Inpatient Hospital Stay (HOSPITAL_COMMUNITY)
Admission: EM | Admit: 2019-06-26 | Discharge: 2019-07-11 | DRG: 871 | Disposition: E | Payer: Medicare Other | Source: Skilled Nursing Facility | Attending: Emergency Medicine | Admitting: Emergency Medicine

## 2019-06-26 DIAGNOSIS — E860 Dehydration: Secondary | ICD-10-CM | POA: Diagnosis present

## 2019-06-26 DIAGNOSIS — U071 COVID-19: Secondary | ICD-10-CM | POA: Diagnosis present

## 2019-06-26 DIAGNOSIS — I10 Essential (primary) hypertension: Secondary | ICD-10-CM | POA: Diagnosis present

## 2019-06-26 DIAGNOSIS — Z8249 Family history of ischemic heart disease and other diseases of the circulatory system: Secondary | ICD-10-CM | POA: Diagnosis not present

## 2019-06-26 DIAGNOSIS — A4189 Other specified sepsis: Secondary | ICD-10-CM | POA: Diagnosis present

## 2019-06-26 DIAGNOSIS — J1282 Pneumonia due to coronavirus disease 2019: Secondary | ICD-10-CM | POA: Diagnosis present

## 2019-06-26 DIAGNOSIS — Z823 Family history of stroke: Secondary | ICD-10-CM

## 2019-06-26 DIAGNOSIS — Z79899 Other long term (current) drug therapy: Secondary | ICD-10-CM

## 2019-06-26 DIAGNOSIS — E870 Hyperosmolality and hypernatremia: Secondary | ICD-10-CM | POA: Diagnosis present

## 2019-06-26 DIAGNOSIS — E785 Hyperlipidemia, unspecified: Secondary | ICD-10-CM | POA: Diagnosis present

## 2019-06-26 DIAGNOSIS — Z79890 Hormone replacement therapy (postmenopausal): Secondary | ICD-10-CM

## 2019-06-26 DIAGNOSIS — Z882 Allergy status to sulfonamides status: Secondary | ICD-10-CM

## 2019-06-26 DIAGNOSIS — Z66 Do not resuscitate: Secondary | ICD-10-CM | POA: Diagnosis present

## 2019-06-26 DIAGNOSIS — E039 Hypothyroidism, unspecified: Secondary | ICD-10-CM | POA: Diagnosis present

## 2019-06-26 DIAGNOSIS — J1289 Other viral pneumonia: Secondary | ICD-10-CM | POA: Diagnosis present

## 2019-06-26 DIAGNOSIS — R230 Cyanosis: Secondary | ICD-10-CM | POA: Diagnosis present

## 2019-06-26 DIAGNOSIS — R7303 Prediabetes: Secondary | ICD-10-CM | POA: Diagnosis present

## 2019-06-26 DIAGNOSIS — N179 Acute kidney failure, unspecified: Secondary | ICD-10-CM | POA: Diagnosis present

## 2019-06-26 DIAGNOSIS — G931 Anoxic brain damage, not elsewhere classified: Secondary | ICD-10-CM | POA: Diagnosis present

## 2019-06-26 DIAGNOSIS — Z888 Allergy status to other drugs, medicaments and biological substances status: Secondary | ICD-10-CM | POA: Diagnosis not present

## 2019-06-26 DIAGNOSIS — R6520 Severe sepsis without septic shock: Secondary | ICD-10-CM | POA: Diagnosis present

## 2019-06-26 DIAGNOSIS — J9601 Acute respiratory failure with hypoxia: Secondary | ICD-10-CM | POA: Diagnosis present

## 2019-06-26 DIAGNOSIS — J069 Acute upper respiratory infection, unspecified: Secondary | ICD-10-CM | POA: Diagnosis present

## 2019-06-26 LAB — LACTATE DEHYDROGENASE: LDH: 343 U/L — ABNORMAL HIGH (ref 98–192)

## 2019-06-26 LAB — COMPREHENSIVE METABOLIC PANEL
ALT: 25 U/L (ref 0–44)
AST: 38 U/L (ref 15–41)
Albumin: 2.7 g/dL — ABNORMAL LOW (ref 3.5–5.0)
Alkaline Phosphatase: 60 U/L (ref 38–126)
Anion gap: 21 — ABNORMAL HIGH (ref 5–15)
BUN: 57 mg/dL — ABNORMAL HIGH (ref 8–23)
CO2: 23 mmol/L (ref 22–32)
Calcium: 9.2 mg/dL (ref 8.9–10.3)
Chloride: 113 mmol/L — ABNORMAL HIGH (ref 98–111)
Creatinine, Ser: 2.39 mg/dL — ABNORMAL HIGH (ref 0.44–1.00)
GFR calc Af Amer: 21 mL/min — ABNORMAL LOW (ref >60)
GFR calc non Af Amer: 18 mL/min — ABNORMAL LOW (ref >60)
Glucose, Bld: 156 mg/dL — ABNORMAL HIGH (ref 70–99)
Potassium: 4.3 mmol/L (ref 3.5–5.1)
Sodium: 157 mmol/L — ABNORMAL HIGH (ref 135–145)
Total Bilirubin: 1 mg/dL (ref 0.3–1.2)
Total Protein: 6.8 g/dL (ref 6.5–8.1)

## 2019-06-26 LAB — TROPONIN I (HIGH SENSITIVITY)
Troponin I (High Sensitivity): 146 ng/L (ref <18)
Troponin I (High Sensitivity): 191 ng/L (ref <18)

## 2019-06-26 LAB — POCT I-STAT 7, (LYTES, BLD GAS, ICA,H+H)
Bicarbonate: 20.2 mmol/L (ref 20.0–28.0)
Calcium, Ion: 1.16 mmol/L (ref 1.15–1.40)
HCT: 47 % — ABNORMAL HIGH (ref 36.0–46.0)
Hemoglobin: 16 g/dL — ABNORMAL HIGH (ref 12.0–15.0)
O2 Saturation: 99 %
Potassium: 3.6 mmol/L (ref 3.5–5.1)
Sodium: 152 mmol/L — ABNORMAL HIGH (ref 135–145)
TCO2: 21 mmol/L — ABNORMAL LOW (ref 22–32)
pCO2 arterial: 22.7 mmHg — ABNORMAL LOW (ref 32.0–48.0)
pH, Arterial: 7.558 — ABNORMAL HIGH (ref 7.350–7.450)
pO2, Arterial: 116 mmHg — ABNORMAL HIGH (ref 83.0–108.0)

## 2019-06-26 LAB — CBC WITH DIFFERENTIAL/PLATELET
Abs Immature Granulocytes: 0 10*3/uL (ref 0.00–0.07)
Basophils Absolute: 0 10*3/uL (ref 0.0–0.1)
Basophils Relative: 0 %
Eosinophils Absolute: 0 10*3/uL (ref 0.0–0.5)
Eosinophils Relative: 0 %
HCT: 55.1 % — ABNORMAL HIGH (ref 36.0–46.0)
Hemoglobin: 18.1 g/dL — ABNORMAL HIGH (ref 12.0–15.0)
Lymphocytes Relative: 8 %
Lymphs Abs: 1.4 10*3/uL (ref 0.7–4.0)
MCH: 31.6 pg (ref 26.0–34.0)
MCHC: 32.8 g/dL (ref 30.0–36.0)
MCV: 96.2 fL (ref 80.0–100.0)
Monocytes Absolute: 0.7 10*3/uL (ref 0.1–1.0)
Monocytes Relative: 4 %
Neutro Abs: 15.8 10*3/uL — ABNORMAL HIGH (ref 1.7–7.7)
Neutrophils Relative %: 88 %
Platelets: 277 10*3/uL (ref 150–400)
RBC: 5.73 MIL/uL — ABNORMAL HIGH (ref 3.87–5.11)
RDW: 18.6 % — ABNORMAL HIGH (ref 11.5–15.5)
WBC: 17.9 10*3/uL — ABNORMAL HIGH (ref 4.0–10.5)
nRBC: 0 /100 WBC
nRBC: 0.5 % — ABNORMAL HIGH (ref 0.0–0.2)

## 2019-06-26 LAB — FIBRINOGEN: Fibrinogen: 613 mg/dL — ABNORMAL HIGH (ref 210–475)

## 2019-06-26 LAB — D-DIMER, QUANTITATIVE: D-Dimer, Quant: 2.28 ug/mL-FEU — ABNORMAL HIGH (ref 0.00–0.50)

## 2019-06-26 LAB — LACTIC ACID, PLASMA
Lactic Acid, Venous: 5.2 mmol/L (ref 0.5–1.9)
Lactic Acid, Venous: 8.5 mmol/L (ref 0.5–1.9)

## 2019-06-26 LAB — TRIGLYCERIDES: Triglycerides: 195 mg/dL — ABNORMAL HIGH (ref <150)

## 2019-06-26 LAB — FERRITIN: Ferritin: 878 ng/mL — ABNORMAL HIGH (ref 11–307)

## 2019-06-26 LAB — C-REACTIVE PROTEIN: CRP: 15.2 mg/dL — ABNORMAL HIGH (ref <1.0)

## 2019-06-26 LAB — PROCALCITONIN: Procalcitonin: 0.75 ng/mL

## 2019-06-26 LAB — BRAIN NATRIURETIC PEPTIDE: B Natriuretic Peptide: 768.1 pg/mL — ABNORMAL HIGH (ref 0.0–100.0)

## 2019-06-26 MED ORDER — VANCOMYCIN HCL 10 G IV SOLR
1250.0000 mg | Freq: Once | INTRAVENOUS | Status: AC
  Administered 2019-06-26: 18:00:00 1250 mg via INTRAVENOUS
  Filled 2019-06-26: qty 1250

## 2019-06-26 MED ORDER — SODIUM CHLORIDE 0.9 % IV SOLN
200.0000 mg | Freq: Once | INTRAVENOUS | Status: AC
  Administered 2019-06-26: 200 mg via INTRAVENOUS
  Filled 2019-06-26: qty 40

## 2019-06-26 MED ORDER — SODIUM CHLORIDE 0.9 % IV SOLN: 100.0000 mg | INTRAVENOUS | Status: DC

## 2019-06-26 MED ORDER — VANCOMYCIN VARIABLE DOSE PER UNSTABLE RENAL FUNCTION (PHARMACIST DOSING): Status: DC

## 2019-06-26 MED ORDER — SODIUM CHLORIDE 0.9 % IV SOLN
2.0000 g | Freq: Once | INTRAVENOUS | Status: AC
  Administered 2019-06-26: 2 g via INTRAVENOUS
  Filled 2019-06-26: qty 2

## 2019-06-26 MED ORDER — ACETAMINOPHEN 650 MG RE SUPP
650.0000 mg | Freq: Once | RECTAL | Status: AC
  Administered 2019-06-26: 650 mg via RECTAL
  Filled 2019-06-26: qty 1

## 2019-06-26 MED ORDER — SODIUM CHLORIDE 0.9 % IV SOLN
Freq: Once | INTRAVENOUS | Status: AC
  Administered 2019-06-26: via INTRAVENOUS

## 2019-06-26 NOTE — ED Provider Notes (Signed)
[name] EMERGENCY DEPARTMENT Provider Note   CSN: [id number] Arrival date & time: 07/13/19  1538     History   Chief Complaint Chief Complaint  Patient presents with   Shortness of Breath    HPI Stephanie Weeks is a 85 y.o. female.     The history is provided by the EMS personnel, medical records and a relative. No language interpreter was used.   Stephanie Weeks is a 85 y.o. female who presents to the Emergency Department complaining of sob. Level V caveat due to ams. History is provided by EMS and the patient's daughter. Per EMS the patient is COVID positive and has been experiencing increased shortness of breath for an unclear amount of time. Additional history provided by patient's daughter, Stephanie Weeks. She states that last Wednesday Stephanie Weeks tested positive for COVID-19. Today her daughter received a call that her oxygen level had dropped to the 70s and she required supplemental oxygen. Later today she received another call stating that her condition was worsening and she was being transferred to the emergency department for further evaluation.  Stephanie Weeks has not seen Stephanie Weeks since March due to the COVID-19 pandemic. Initially Stephanie Weeks was calling multiple times a day and interactive with her daughter over the phone. Her daughter states that in the last six weeks that she had stopped calling and was sleeping more.   Past Medical History:  Diagnosis Date   Dementia (HCC)    DJD (degenerative joint disease)    Hyperlipidemia    Hypertension    Hypothyroid    Pre-diabetes     Patient Active Problem List   Diagnosis Date Noted   Pneumonia due to COVID-19 virus 07/13/19   Acute respiratory disease due to COVID-19 virus Weeks 03, 2020   Vitamin D deficiency 11/01/2013   Encounter for long-term (current) use of other medications 11/01/2013   Rt. 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 11/01/2013   Hyperlipidemia    Hypertension    Pre-diabetes    DJD (degenerative joint disease)    Hypothyroid     Past Surgical History:  Procedure Laterality Date   CARDIAC CATHETERIZATION     negative   CORNEAL TRANSPLANT Left 1985   CORNEAL TRANSPLANT Right 1991   ENTEROCELE REPAIR  2001   posterior   FLEXIBLE SIGMOIDOSCOPY  [id number]   KNEE ARTHROSCOPY Right 1995   LIPOMA EXCISION Left 2004   parascapular lipoma. benign.   MYRINGOTOMY Left 1990   RETINAL DETACHMENT SURGERY Right 2001   TOTAL ABDOMINAL HYSTERECTOMY  1985     OB History   No obstetric history on file.      Home Medications    Prior to Admission medications   Medication Sig Start Date End Date Taking? Authorizing Provider  acetaminophen (TYLENOL) 500 MG tablet Take 500 mg by mouth every 4 (four) hours as needed for moderate pain.   Yes [provider]  bisacodyl (DULCOLAX) 10 MG suppository Place 10 mg rectally daily as needed for moderate constipation.   Yes [provider]  cetirizine (ZYRTEC) 10 MG tablet Take 1 tablet (10 mg total) by mouth daily. For allergies Patient taking differently: Take 10 mg by mouth every morning. For allergies 08/07/13  Yes Lucky Cowboy, MD  divalproex (DEPAKOTE) 250 MG DR tablet Take 250 mg by mouth 3 (three) times daily.   Yes [provider]  enalapril (VASOTEC) 20 MG tablet TAKE 1 TABLET EVERY DAY FOR BLOOD  PRESSURE Patient taking differently: Take 20 mg by mouth every morning.  02/27/14  Yes Vicie Mutters, PA-C  ENSURE PLUS (ENSURE PLUS) LIQD Take 237 mLs by mouth 2 (two) times daily between meals.    Yes [provider]  guaiFENesin (ROBITUSSIN) 100 MG/5ML liquid Take 200 mg by mouth every 4 (four) hours as needed for cough.    Yes [provider]  hydrOXYzine (ATARAX/VISTARIL) 25 MG tablet Take 25 mg by mouth 3 (three) times daily. For anxiety and atopic dermatitis   Yes  [provider]  levothyroxine (SYNTHROID, LEVOTHROID) 50 MCG tablet Take 1 tablet (50 mcg total) by mouth daily before breakfast. Patient taking differently: Take 50 mcg by mouth daily at 6 (six) AM.  06/06/14  Yes Unk Pinto, MD  loperamide (IMODIUM) 2 MG capsule Take 2 mg by mouth daily as needed for diarrhea or loose stools.   Yes [provider]  LORazepam (ATIVAN) 0.5 MG tablet Take 1 tablet (0.5 mg total) by mouth 3 (three) times daily. 8 AM, 2 PM, 8 PM Patient taking differently: Take 0.5 mg by mouth 3 (three) times daily. 10am, 4pm, 10pm 09/09/18  Yes Nanavati, Ankit, MD  magnesium hydroxide (MILK OF MAGNESIA) 400 MG/5ML suspension Take 30 mLs by mouth daily as needed for mild constipation.    Yes [provider]  Melatonin 5 MG TABS Take 10 mg by mouth at bedtime.   Yes [provider]  omeprazole (PRILOSEC) 20 MG capsule Take 20 mg by mouth daily at 6 (six) AM.   Yes [provider]  PARoxetine (PAXIL) 40 MG tablet Take 40 mg by mouth every morning.    Yes [provider]  polyethylene glycol (MIRALAX / GLYCOLAX) packet Take 17 g by mouth daily as needed for moderate constipation. Mix in 4-8 oz of fluid   Yes [provider]  senna-docusate (SENOKOT-S) 8.6-50 MG tablet Take 1 tablet by mouth every morning.    Yes [provider]  sodium phosphate Pediatric (FLEET) 3.5-9.5 GM/59ML enema Place 1 enema rectally once as needed (constipation not relieved by suppository).   Yes [provider]  traZODone (DESYREL) 50 MG tablet Take 50 mg by mouth at bedtime.   Yes [provider]  triamcinolone cream (KENALOG) 0.1 % Apply 1 application topically 2 (two) times daily. Apply to both arms for chronic dermatitis   Yes [provider]  acetaminophen-codeine (TYLENOL #3) 300-30 MG tablet TAKE 1 TABLET BY MOUTH 4 TIMES A DAY AS NEEDED FOR PAIN Patient not taking: Reported on 06/28/2019 09/09/18    Varney Biles, MD  methocarbamol (ROBAXIN) 500 MG tablet Take 1 tablet (500 mg total) by mouth 2 (two) times daily. Patient not taking: Reported on 09/08/2018 05/17/18   Lacretia Leigh, MD    Family History Family History  Problem Relation Age of Onset   Hypertension Mother    Stroke Mother    Heart disease Father    Heart attack Father    Heart disease Brother    Hypertension Brother    Hypertension Brother    Hypertension Sister    Hypertension Sister    Cancer Sister        ovary   Hypertension Daughter     Social History Social History   Tobacco Use   Smoking status: Never Smoker   Smokeless tobacco: Never Used  Substance Use Topics   Alcohol use: No   Drug use: No     Allergies   Alprazolam, Citalopram, Prednisone,  and Sulfa antibiotics   Review of Systems Review of Systems  Unable to perform ROS: Mental status change     Physical Exam Updated Vital Signs BP (!) 135/113 (BP Location: Right Arm)    Pulse (!) 125    Temp (!) 102.8 F (39.3 C) (Rectal)    Resp (!) 125    Ht 5\' 7"  (1.702 m)    Wt 54.4 kg    BMI 18.79 kg/m   Physical Exam Vitals signs and nursing note reviewed.  Constitutional:      General: She is in acute distress.     Appearance: She is well-developed. She is ill-appearing.  HENT:     Head: Normocephalic and atraumatic.     Mouth/Throat:     Mouth: Mucous membranes are dry.  Cardiovascular:     Rate and Rhythm: Regular rhythm. Tachycardia present.  Pulmonary:     Effort: No respiratory distress.     Comments: Tachypnea Abdominal:     Palpations: Abdomen is soft.     Tenderness: There is no abdominal tenderness. There is no guarding or rebound.  Musculoskeletal:        General: No tenderness.  Skin:    General: Skin is warm and dry.  Neurological:     Comments: Lethargic, moans and opens eyes to painful stimuli.  Moves all extremities symmetrically but weakly  Psychiatric:     Comments: unable to assess.        ED Treatments / Results  Labs (all labs ordered are listed, but only abnormal results are displayed) Labs Reviewed  LACTIC ACID, PLASMA - Abnormal; Notable for the following components:      Result Value   Lactic Acid, Venous 5.2 (*)    All other components within normal limits  LACTIC ACID, PLASMA - Abnormal; Notable for the following components:   Lactic Acid, Venous 8.5 (*)    All other components within normal limits  CBC WITH DIFFERENTIAL/PLATELET - Abnormal; Notable for the following components:   WBC 17.9 (*)    RBC 5.73 (*)    Hemoglobin 18.1 (*)    HCT 55.1 (*)    RDW 18.6 (*)    nRBC 0.5 (*)    Neutro Abs 15.8 (*)    All other components within normal limits  COMPREHENSIVE METABOLIC PANEL - Abnormal; Notable for the following components:   Sodium 157 (*)    Chloride 113 (*)    Glucose, Bld 156 (*)    BUN 57 (*)    Creatinine, Ser 2.39 (*)    Albumin 2.7 (*)    GFR calc non Af Amer 18 (*)    GFR calc Af Amer 21 (*)    Anion gap 21 (*)    All other components within normal limits  D-DIMER, QUANTITATIVE (NOT AT [facility]) - Abnormal; Notable for the following components:   D-Dimer, Quant 2.28 (*)    All other components within normal limits  LACTATE DEHYDROGENASE - Abnormal; Notable for the following components:   LDH 343 (*)    All other components within normal limits  FERRITIN - Abnormal; Notable for the following components:   Ferritin 878 (*)    All other components within normal limits  TRIGLYCERIDES - Abnormal; Notable for the following components:   Triglycerides 195 (*)    All other components within normal limits  FIBRINOGEN - Abnormal; Notable for the following components:   Fibrinogen 613 (*)    All other components within normal limits  C-REACTIVE PROTEIN -  Abnormal; Notable for the following components:   CRP 15.2 (*)    All other components within normal limits  BRAIN NATRIURETIC PEPTIDE - Abnormal; Notable for the following components:   B  Natriuretic Peptide 768.1 (*)    All other components within normal limits  POCT I-STAT 7, (LYTES, BLD GAS, ICA,H+H) - Abnormal; Notable for the following components:   pH, Arterial 7.558 (*)    pCO2 arterial 22.7 (*)    pO2, Arterial 116.0 (*)    TCO2 21 (*)    Sodium 152 (*)    HCT 47.0 (*)    Hemoglobin 16.0 (*)    All other components within normal limits  TROPONIN I (HIGH SENSITIVITY) - Abnormal; Notable for the following components:   Troponin I (High Sensitivity) 146 (*)    All other components within normal limits  CULTURE, BLOOD (ROUTINE X 2)  CULTURE, BLOOD (ROUTINE X 2)  PROCALCITONIN  I-STAT ARTERIAL BLOOD GAS, ED  TROPONIN I (HIGH SENSITIVITY)    EKG EKG Interpretation  Date/Time:  Monday 07/21/19 15:52:10 EST Ventricular Rate:  125 PR Interval:    QRS Duration: 101 QT Interval:  333 QTC Calculation: 481 R Axis:   -55 Text Interpretation: Sinus tachycardia Multiple premature complexes, vent & supraven Probable left atrial enlargement LVH with secondary repolarization abnormality Inferior infarct, old Anterior Q waves, possibly due to LVH Confirmed by Tilden Fossa [id number]) on 2019-07-21 4:14:47 PM   Radiology Dg Chest Port 1 View  Result Date: 07-21-19 CLINICAL DATA:  Shortness of breath EXAM: PORTABLE CHEST 1 VIEW COMPARISON:  09/09/2018 FINDINGS: Minimal streaky atelectasis or scar at the right base. Chronic elevation left diaphragm with left basilar atelectasis. Possible small left pleural effusion. Stable cardiomediastinal silhouette with aortic atherosclerosis. No pneumothorax. IMPRESSION: 1. Possible small left pleural effusion. Chronic elevation left diaphragm with basilar atelectasis. Electronically Signed   By: Stephanie Pang M.D.   On: Jul 21, 2019 17:30    Procedures Procedures (including critical care time) CRITICAL CARE Performed by: Tilden Fossa   Total critical care time: 45 minutes  Critical care time was exclusive of separately  billable procedures and treating other patients.  Critical care was necessary to treat or prevent imminent or life-threatening deterioration.  Critical care was time spent personally by me on the following activities: development of treatment plan with patient and/or surrogate as well as nursing, discussions with consultants, evaluation of patient's response to treatment, examination of patient, obtaining history from patient or surrogate, ordering and performing treatments and interventions, ordering and review of laboratory studies, ordering and review of radiographic studies, pulse oximetry and re-evaluation of patient's condition.  Medications Ordered in ED Medications  0.9 %  sodium chloride infusion (has no administration in time range)  vancomycin variable dose per unstable renal function (pharmacist dosing) (has no administration in time range)  remdesivir 200 mg in sodium chloride 0.9 % 250 mL IVPB (has no administration in time range)    Followed by  remdesivir 100 mg in sodium chloride 0.9 % 250 mL IVPB (has no administration in time range)  ceFEPIme (MAXIPIME) 2 g in sodium chloride 0.9 % 100 mL IVPB (0 g Intravenous Stopped 07-21-2019 1814)  vancomycin (VANCOCIN) 1,250 mg in sodium chloride 0.9 % 250 mL IVPB (1,250 mg Intravenous New Bag/Given Jul 21, 2019 1815)     Initial Impression / Assessment and Plan / ED Course  I have reviewed the triage vital signs and the nursing notes.  Pertinent labs & imaging results that were available  during my care of the patient were reviewed by me and considered in my medical decision making (see chart for details).        Patient here for evaluation of shortness of breath, hypoxia from her nursing facility. On evaluation she is ill appearing in dehydrated with increased work of breathing. She was treated with antibiotics for possible pneumonia as well as gentle fluid hydration. Aggressive fluid hydration was withheld due to patient's COVID-19  infection and concern for causing worsening respiratory status. Discussed with patient's daughter, Stephanie DecemberSharon her clinical condition and lab findings. Discussed recommendation for admission and her daughter is in agreement with treatment plan. Daughter does state that patient would want to be DNR/DNI if her condition were to deteriorate. Hospitalist consulted for admission for further treatment.  Stephanie Weeks was evaluated in Emergency Department on 06/23/2019 for the symptoms described in the history of present illness. She was evaluated in the context of the global COVID-19 pandemic, which necessitated consideration that the patient might be at risk for infection with the SARS-CoV-2 virus that causes COVID-19. Institutional protocols and algorithms that pertain to the evaluation of patients at risk for COVID-19 are in a state of rapid change based on information released by regulatory bodies including the CDC and federal and state organizations. These policies and algorithms were followed during the patient's care in the ED.   Final Clinical Impressions(s) / ED Diagnoses   Final diagnoses:  COVID-19 virus infection  Acute respiratory failure with hypoxia (HCC)  AKI (acute kidney injury) [facility](HCC)  Dehydration    ED Discharge Orders    None       Tilden Fossaees, [employer], MD 06/16/2019 2139

## 2019-06-26 NOTE — ED Notes (Signed)
Ivin Booty williams POA [phone] when you have time call with an update

## 2019-06-26 NOTE — Progress Notes (Signed)
Pharmacy Antibiotic Note  Stephanie Weeks is a 85 y.o. female admitted on 07-05-2019 with pneumonia.  Pharmacy has been consulted for Vancomycin dosing.  Height: 5\' 7"  (170.2 cm) Weight: 120 lb (54.4 kg) IBW/kg (Calculated) : 61.6  Temp (24hrs), Avg:102.8 F (39.3 C), Min:102.8 F (39.3 C), Max:102.8 F (39.3 C)  Recent Labs  Lab 07-05-2019 1720 2019-07-05 1802  WBC  --  17.9*  CREATININE  --  2.39*  LATICACIDVEN 5.2*  --     Estimated Creatinine Clearance: 15 mL/min (A) (by C-G formula based on SCr of 2.39 mg/dL (H)).    Allergies  Allergen Reactions  . Alprazolam Other (See Comments)    constipation  . Citalopram Itching  . Prednisone     DUE TO GLACOMA  . Sulfa Antibiotics Rash    Antimicrobials this admission: 11/16 Cefepime >> only 1 dose ordered 11/16 Vancomycin >>   Dose adjustments this admission: N/a  Microbiology results: 11/16 BCx: Pending  Plan: - Patient appears to have an AKI  - Loading dose of Vancomcyin 1250 mg IV x 1 dose  - Will continue to dose Vancomycin by random levels until patient renal fxn improves.  - Will monitor for renal fxn improvement and adjust vancomcyin interval as needed.  Thank you for allowing pharmacy to be a part of this patient's care.  Duanne Limerick PharmD. BCPS  Jul 05, 2019 7:31 PM

## 2019-06-26 NOTE — ED Triage Notes (Signed)
Pt to ED via [facility] from [facility] with c/o shortness of breath and altered mental status.  EMS also reports pt is Covid 19 +

## 2019-06-27 LAB — SARS CORONAVIRUS 2 (TAT 6-24 HRS): SARS Coronavirus 2: POSITIVE — AB

## 2019-07-01 LAB — CULTURE, BLOOD (ROUTINE X 2)
Culture: NO GROWTH
Culture: NO GROWTH
Special Requests: ADEQUATE

## 2019-07-11 NOTE — Death Summary Note (Signed)
DEATH SUMMARY   Patient Details  Name: Stephanie Weeks MRN: [id number] DOB: Jun 18, 1933  Admission/Discharge Information   Admit Date:  July 04, 2019  Date of Death: Date of Death: 07-05-19  Time of Death: Time of Death: 0100  Length of Stay: 1  Referring Physician: System, Pcp Not In   Reason(s) for Hospitalization  Severe respiratory failure with hypoxemia Pneumonia secondary to COVID-19 Positive COVID-19 Sepsis Hypernatremia Acute kidney injury  Diagnoses  Preliminary cause of death:  Secondary Diagnoses (including complications and co-morbidities):  Principal Problem:   Pneumonia due to COVID-19 virus Active Problems:   Hyperlipidemia   Hypertension   Pre-diabetes   Hypothyroid   Acute respiratory disease due to COVID-19 virus   Brief Hospital Course (including significant findings, care, treatment, and services provided and events leading to death)  Stephanie Weeks is a 85 y.o. year old female who past medical history of hypertension hyperlipidemia hypothyroidism was diagnosed with COVID-19 in nursing home.  Patient became hypoxic in the nursing home, she was brought to emergency room.  In the emergency room she was found to be in severe respiratory distress and with high flow oxygen and nonrebreathing mask hardly keep saturation around 80% Patient was cyanotic from the waist down Was hypotensive and tachycardic  Blood work showed severe metabolic acidosis hypernatremia elevated BUN and creatinine. She had criteria for severe sepsis as well.  Her lactic acid went from 5.2-8.5 Started empirically on antibiotics Slowly she deteriorates became more hypoxic unable to maintain the blood pressure became bradycardic and she went to cardiac arrest Patient is DNR DO NOT RESUSCITATE   Pertinent Labs and Studies  Significant Diagnostic Studies Dg Chest Port 1 View  Result Date: July 04, 2019 CLINICAL DATA:  Shortness of breath EXAM: PORTABLE CHEST 1 VIEW  COMPARISON:  09/09/2018 FINDINGS: Minimal streaky atelectasis or scar at the right base. Chronic elevation left diaphragm with left basilar atelectasis. Possible small left pleural effusion. Stable cardiomediastinal silhouette with aortic atherosclerosis. No pneumothorax. IMPRESSION: 1. Possible small left pleural effusion. Chronic elevation left diaphragm with basilar atelectasis. Electronically Signed   By: Donavan Foil M.D.   On: July 04, 2019 17:30    Microbiology Recent Results (from the past 240 hour(s))  Blood Culture (routine x 2)     Status: None (Preliminary result)   Collection Time: 07-04-2019  5:10 PM   Specimen: BLOOD RIGHT ARM  Result Value Ref Range Status   Specimen Description BLOOD RIGHT ARM  Final   Special Requests   Final    BOTTLES DRAWN AEROBIC AND ANAEROBIC Blood Culture adequate volume   Culture   Final    NO GROWTH 2 DAYS Performed at [facility], [place] [address], [place], [place] [address]    Report Status PENDING  Incomplete  Blood Culture (routine x 2)     Status: None (Preliminary result)   Collection Time: Jul 04, 2019  5:10 PM   Specimen: BLOOD RIGHT HAND  Result Value Ref Range Status   Specimen Description BLOOD RIGHT HAND  Final   Special Requests   Final    BOTTLES DRAWN AEROBIC AND ANAEROBIC Blood Culture results may not be optimal due to an inadequate volume of blood received in culture bottles   Culture   Final    NO GROWTH 2 DAYS Performed at [facility], [place] [address], [place], [place] [address]    Report Status PENDING  Incomplete  SARS CORONAVIRUS 2 (TAT 6-24 HRS) Nasopharyngeal Nasopharyngeal Swab     Status: Abnormal  Collection Time: 2019/07/06 12:19 AM   Specimen: Nasopharyngeal Swab  Result Value Ref Range Status   SARS Coronavirus 2 POSITIVE (A) NEGATIVE Final    Comment: (NOTE) SARS-CoV-2 target nucleic acids are DETECTED. The SARS-CoV-2 RNA is generally detectable in upper and lower respiratory specimens during the acute  phase of infection. Positive results are indicative of active infection with SARS-CoV-2. Clinical  correlation with patient history and other diagnostic information is necessary to determine patient infection status. Positive results do  not rule out bacterial infection or co-infection with other viruses. The expected result is Negative. Fact Sheet for Patients: HairSlick.no Fact Sheet for Healthcare Providers: quierodirigir.com This test is not yet approved or cleared by the [place] FDA and  has been authorized for detection and/or diagnosis of SARS-CoV-2 by FDA under an Emergency Use Authorization (EUA). This EUA will remain  in effect (meaning this test can be used) for the duration of the COVID-19 declaration under Section 564(b)(1) of the Act, 21 U.S.C.  section 360bbb-3(b)(1), unless the authorization is terminated or revoked sooner. Performed at [facility], [address], [place], [place] [address]     Lab Basic Metabolic Panel: Recent Labs  Lab 06/12/2019 1728 06/14/2019 1802  NA 152* 157*  K 3.6 4.3  CL  --  113*  CO2  --  23  GLUCOSE  --  156*  BUN  --  57*  CREATININE  --  2.39*  CALCIUM  --  9.2   Liver Function Tests: Recent Labs  Lab 07/08/2019 1802  AST 38  ALT 25  ALKPHOS 60  BILITOT 1.0  PROT 6.8  ALBUMIN 2.7*   No results for input(s): LIPASE, AMYLASE in the last 168 hours. No results for input(s): AMMONIA in the last 168 hours. CBC: Recent Labs  Lab 07/01/2019 1728 06/21/2019 1802  WBC  --  17.9*  NEUTROABS  --  15.8*  HGB 16.0* 18.1*  HCT 47.0* 55.1*  MCV  --  96.2  PLT  --  277   Cardiac Enzymes: No results for input(s): CKTOTAL, CKMB, CKMBINDEX, TROPONINI in the last 168 hours. Sepsis Labs: Recent Labs  Lab 07/10/2019 1720 06/11/2019 1802 07/01/2019 2028  PROCALCITON  --  0.75  --   WBC  --  17.9*  --   LATICACIDVEN 5.2*  --  8.5*    Procedures/Operations      Stephanie Weeks Stephanie Weeks 06/28/2019, 10:03 PM

## 2019-07-11 NOTE — ED Notes (Signed)
Unable to obtain B/P at this time.  Pt unresponsive

## 2019-07-11 NOTE — ED Notes (Signed)
Pt pronounced by Dr. Riki Rusk Cristescu at 01:00

## 2019-07-11 NOTE — H&P (Addendum)
History and Physical    Stephanie Weeks ONG:[id number] DOB: Apr 19, 1933 DOA: 07/11/19  PCP: System, Pcp Not In    Patient coming from: Nursing home    Chief Complaint: Patient unresponsive, shortness of breath, Covid positive at nursing home  HPI: Stephanie Weeks is a 85 y.o. female with medical history significant of hypertension, hyperlipidemia, prediabetes.  85 years old female nursing home resident came to emergency room with a chief complaint of shortness of breath unresponsive unclear amount of time.  History provided by patient's daughter she states that last Wednesday Ms. Ratliff was tested positive for COVID-19.  Today her daughter received a call that her oxygen level has dropped to 70.  Later today she received another call stating that her condition was worsening and she has been transferred to the emergency room for further evaluation.  ED Course:  Patient in emergency room Unresponsive Severe hypoxemia On nonrebreather and high flow oxygen Hypernatremia with sodium 157 BUN 57 creatinine 2.4 Troponin I 191 Lactic acid 8.5  Review of Systems: As per HPI otherwise 10 point review of systems negative.  Unable to evaluate because of the patient mental status, unresponsive  Past Medical History:  Diagnosis Date  . Dementia (HCC)   . DJD (degenerative joint disease)   . Hyperlipidemia   . Hypertension   . Hypothyroid   . Pre-diabetes     Past Surgical History:  Procedure Laterality Date  . CARDIAC CATHETERIZATION     negative  . CORNEAL TRANSPLANT Left 1985  . CORNEAL TRANSPLANT Right 1991  . ENTEROCELE REPAIR  2001   posterior  . FLEXIBLE SIGMOIDOSCOPY  [id number]  . KNEE ARTHROSCOPY Right 1995  . LIPOMA EXCISION Left 2004   parascapular lipoma. benign.  Marland Kitchen MYRINGOTOMY Left 1990  . RETINAL DETACHMENT SURGERY Right 2001  . TOTAL ABDOMINAL HYSTERECTOMY  1985     reports that she has never smoked. She has never used smokeless tobacco.  She reports that she does not drink alcohol or use drugs.  Allergies  Allergen Reactions  . Alprazolam Other (See Comments)    constipation  . Citalopram Itching  . Prednisone Other (See Comments)    DUE TO GLAUCOMA  . Sulfa Antibiotics Rash    Family History  Problem Relation Age of Onset  . Hypertension Mother   . Stroke Mother   . Heart disease Father   . Heart attack Father   . Heart disease Brother   . Hypertension Brother   . Hypertension Brother   . Hypertension Sister   . Hypertension Sister   . Cancer Sister        ovary  . Hypertension Daughter      Prior to Admission medications   Medication Sig Start Date End Date Taking? Authorizing Provider  acetaminophen (TYLENOL) 500 MG tablet Take 500 mg by mouth every 4 (four) hours as needed for moderate pain.   Yes [provider]  bisacodyl (DULCOLAX) 10 MG suppository Place 10 mg rectally daily as needed for moderate constipation.   Yes [provider]  cetirizine (ZYRTEC) 10 MG tablet Take 1 tablet (10 mg total) by mouth daily. For allergies Patient taking differently: Take 10 mg by mouth every morning. For allergies 08/07/13  Yes Lucky Cowboy, MD  divalproex (DEPAKOTE) 250 MG DR tablet Take 250 mg by mouth 3 (three) times daily.   Yes [provider]  enalapril (VASOTEC) 20 MG tablet TAKE 1 TABLET EVERY DAY FOR BLOOD PRESSURE Patient taking differently: Take 20  mg by mouth every morning.  02/27/14  Yes Quentin Mulling, PA-C  ENSURE PLUS (ENSURE PLUS) LIQD Take 237 mLs by mouth 2 (two) times daily between meals.    Yes [provider]  guaiFENesin (ROBITUSSIN) 100 MG/5ML liquid Take 200 mg by mouth every 4 (four) hours as needed for cough.    Yes [provider]  hydrOXYzine (ATARAX/VISTARIL) 25 MG tablet Take 25 mg by mouth 3 (three) times daily. For anxiety and atopic dermatitis   Yes [provider]  levothyroxine (SYNTHROID, LEVOTHROID) 50 MCG tablet Take 1  tablet (50 mcg total) by mouth daily before breakfast. Patient taking differently: Take 50 mcg by mouth daily at 6 (six) AM.  06/06/14  Yes Lucky Cowboy, MD  loperamide (IMODIUM) 2 MG capsule Take 2 mg by mouth daily as needed for diarrhea or loose stools.   Yes [provider]  LORazepam (ATIVAN) 0.5 MG tablet Take 1 tablet (0.5 mg total) by mouth 3 (three) times daily. 8 AM, 2 PM, 8 PM Patient taking differently: Take 0.5 mg by mouth 3 (three) times daily. 10am, 4pm, 10pm 09/09/18  Yes Nanavati, Ankit, MD  magnesium hydroxide (MILK OF MAGNESIA) 400 MG/5ML suspension Take 30 mLs by mouth daily as needed for mild constipation.    Yes [provider]  Melatonin 5 MG TABS Take 10 mg by mouth at bedtime.   Yes [provider]  omeprazole (PRILOSEC) 20 MG capsule Take 20 mg by mouth daily at 6 (six) AM.   Yes [provider]  PARoxetine (PAXIL) 40 MG tablet Take 40 mg by mouth every morning.    Yes [provider]  polyethylene glycol (MIRALAX / GLYCOLAX) packet Take 17 g by mouth daily as needed for moderate constipation. Mix in 4-8 oz of fluid   Yes [provider]  senna-docusate (SENOKOT-S) 8.6-50 MG tablet Take 1 tablet by mouth every morning.    Yes [provider]  sodium phosphate Pediatric (FLEET) 3.5-9.5 GM/59ML enema Place 1 enema rectally once as needed (constipation not relieved by suppository).   Yes [provider]  traZODone (DESYREL) 50 MG tablet Take 50 mg by mouth at bedtime.   Yes [provider]  triamcinolone cream (KENALOG) 0.1 % Apply 1 application topically 2 (two) times daily. Apply to both arms for chronic dermatitis   Yes [provider]  acetaminophen-codeine (TYLENOL #3) 300-30 MG tablet TAKE 1 TABLET BY MOUTH 4 TIMES A DAY AS NEEDED FOR PAIN Patient not taking: Reported on July 13, 2019 09/09/18   Derwood Kaplan, MD  methocarbamol (ROBAXIN) 500 MG tablet Take 1 tablet (500 mg total) by  mouth 2 (two) times daily. Patient not taking: Reported on 09/08/2018 05/17/18   Lorre Nick, MD    Physical Exam: Vitals:   Jul 13, 2019 2045 07-13-2019 2115 Jul 13, 2019 2311 Jul 13, 2019 2315  BP: 120/88 131/73  (!) 55/30  Pulse: 66  (!) 128 60  Resp: (!) 36 (!) 38    Temp:   (!) 102.7 F (39.3 C)   TempSrc:   Rectal   SpO2: (!) 81%   (!) 77%  Weight:      Height:        Constitutional: NAD, calm, comfortable Vitals:   07-13-19 2045 2019/07/13 2115 07-13-2019 2311 07-13-2019 2315  BP: 120/88 131/73  (!) 55/30  Pulse: 66  (!) 128 60  Resp: (!) 36 (!) 38    Temp:   (!) 102.7 F (39.3 C)   TempSrc:   Rectal  SpO2: (!) 81%   (!) 77%  Weight:      Height:       Eyes: PERRL, lids and conjunctivae normal ENMT: Mucous membranes are moist. Posterior pharynx clear of any exudate or lesions.Normal dentition.  Neck: normal, supple, no masses, no thyromegaly Respiratory: Respiration shallow tachypneic Cardiovascular: Tachycardia. No extremity edema. 2+ pedal pulses. No carotid bruits.  Extremities are cyanotic Abdomen: no tenderness, no masses palpated. No hepatosplenomegaly. Bowel sounds positive.  Musculoskeletal: no clubbing / cyanosis. No joint deformity upper and lower extremities. Good ROM, no contractures. Normal muscle tone.  Skin: Cyanosis Neurologic: Patient unresponsive Psychiatric: Unable to evaluate   Labs on Admission: I have personally reviewed following labs and imaging studies  CBC: Recent Labs  Lab 11/29/2018 1728 11/29/2018 1802  WBC  --  17.9*  NEUTROABS  --  15.8*  HGB 16.0* 18.1*  HCT 47.0* 55.1*  MCV  --  96.2  PLT  --  277   Basic Metabolic Panel: Recent Labs  Lab 11/29/2018 1728 11/29/2018 1802  NA 152* 157*  K 3.6 4.3  CL  --  113*  CO2  --  23  GLUCOSE  --  156*  BUN  --  57*  CREATININE  --  2.39*  CALCIUM  --  9.2   GFR: Estimated Creatinine Clearance: 15 mL/min (A) (by C-G formula based on SCr of 2.39 mg/dL (H)). Liver Function Tests: Recent Labs   Lab 11/29/2018 1802  AST 38  ALT 25  ALKPHOS 60  BILITOT 1.0  PROT 6.8  ALBUMIN 2.7*   No results for input(s): LIPASE, AMYLASE in the last 168 hours. No results for input(s): AMMONIA in the last 168 hours. Coagulation Profile: No results for input(s): INR, PROTIME in the last 168 hours. Cardiac Enzymes: No results for input(s): CKTOTAL, CKMB, CKMBINDEX, TROPONINI in the last 168 hours. BNP (last 3 results) No results for input(s): PROBNP in the last 8760 hours. HbA1C: No results for input(s): HGBA1C in the last 72 hours. CBG: No results for input(s): GLUCAP in the last 168 hours. Lipid Profile: Recent Labs    11/29/2018 1638  TRIG 195*   Thyroid Function Tests: No results for input(s): TSH, T4TOTAL, FREET4, T3FREE, THYROIDAB in the last 72 hours. Anemia Panel: Recent Labs    11/29/2018 1802  FERRITIN 878*   Urine analysis:    Component Value Date/Time   COLORURINE YELLOW 09/09/2018 0148   APPEARANCEUR CLOUDY (A) 09/09/2018 0148   LABSPEC 1.021 09/09/2018 0148   PHURINE 5.0 09/09/2018 0148   GLUCOSEU NEGATIVE 09/09/2018 0148   HGBUR NEGATIVE 09/09/2018 0148   BILIRUBINUR NEGATIVE 09/09/2018 0148   KETONESUR 5 (A) 09/09/2018 0148   PROTEINUR NEGATIVE 09/09/2018 0148   UROBILINOGEN 0.2 02/01/2014 1134   NITRITE NEGATIVE 09/09/2018 0148   LEUKOCYTESUR NEGATIVE 09/09/2018 0148    Radiological Exams on Admission: Dg Chest Port 1 View  Result Date: 04/21/202020 CLINICAL DATA:  Shortness of breath EXAM: PORTABLE CHEST 1 VIEW COMPARISON:  09/09/2018 FINDINGS: Minimal streaky atelectasis or scar at the right base. Chronic elevation left diaphragm with left basilar atelectasis. Possible small left pleural effusion. Stable cardiomediastinal silhouette with aortic atherosclerosis. No pneumothorax. IMPRESSION: 1. Possible small left pleural effusion. Chronic elevation left diaphragm with basilar atelectasis. Electronically Signed   By: Jasmine PangKim  Fujinaga M.D.   On: 04/21/202020 17:30     EKG: Independently reviewed.   Assessment/Plan Active Problems:   Pneumonia due to COVID-19 virus   Acute respiratory disease due to COVID-19 virus  Assessment plan  Acute encephalopathy Secondary to hypoxemia and Covid 19 Patient unresponsive, does not respond to any painful stimulus. Plan try to correct hypoxemia.  Acute respiratory failure with hypoxemia secondary to Covid pneumonia Patient with saturation 60-70% on high flow nasal cannula and nonrebreather Plan patient is DNR so cannot be intubated.  Discussed with the family about the prognosis  Pneumonia secondary to COVID-19 Covid markers per protocol remdesivir Decadron  Hypernatremia Sodium 157 secondary to poor oral intake, dehydration Plan D5 water at 125 cc/h  Acute kidney injury Secondary to sepsis and dehydration   DVT prophylaxis: Heparin subcu Code Status: DNR Family Communication: Discussed her status with her daughter prognosis very grim, I called her daughter at 73 and explained the situation as her mom she may pass away tonight Disposition Plan:  Consults called:   Admission status: Full admission   Linard Millers Cheo Selvey MD [facility]  If 7PM-7AM, please contact night-coverage [email or web address]   02-Jul-2019, 12:13 AM

## 2019-07-11 NOTE — Progress Notes (Signed)
Unable to obtain ABG after 2X attempts. RN notified.

## 2019-07-11 NOTE — Progress Notes (Signed)
Progressive deterioration, shallow respiration Blood pressure Unresponsive Passed away and  was pronounced at 1:00 am

## 2019-07-11 DEATH — deceased

## 2019-12-20 IMAGING — DX DG CHEST 1V PORT
1 series · 1 of 1 positions shown · non-contrast
Comparison: 09/09/2018

CLINICAL DATA: Shortness of breath

EXAM:
PORTABLE CHEST 1 VIEW

[chest]
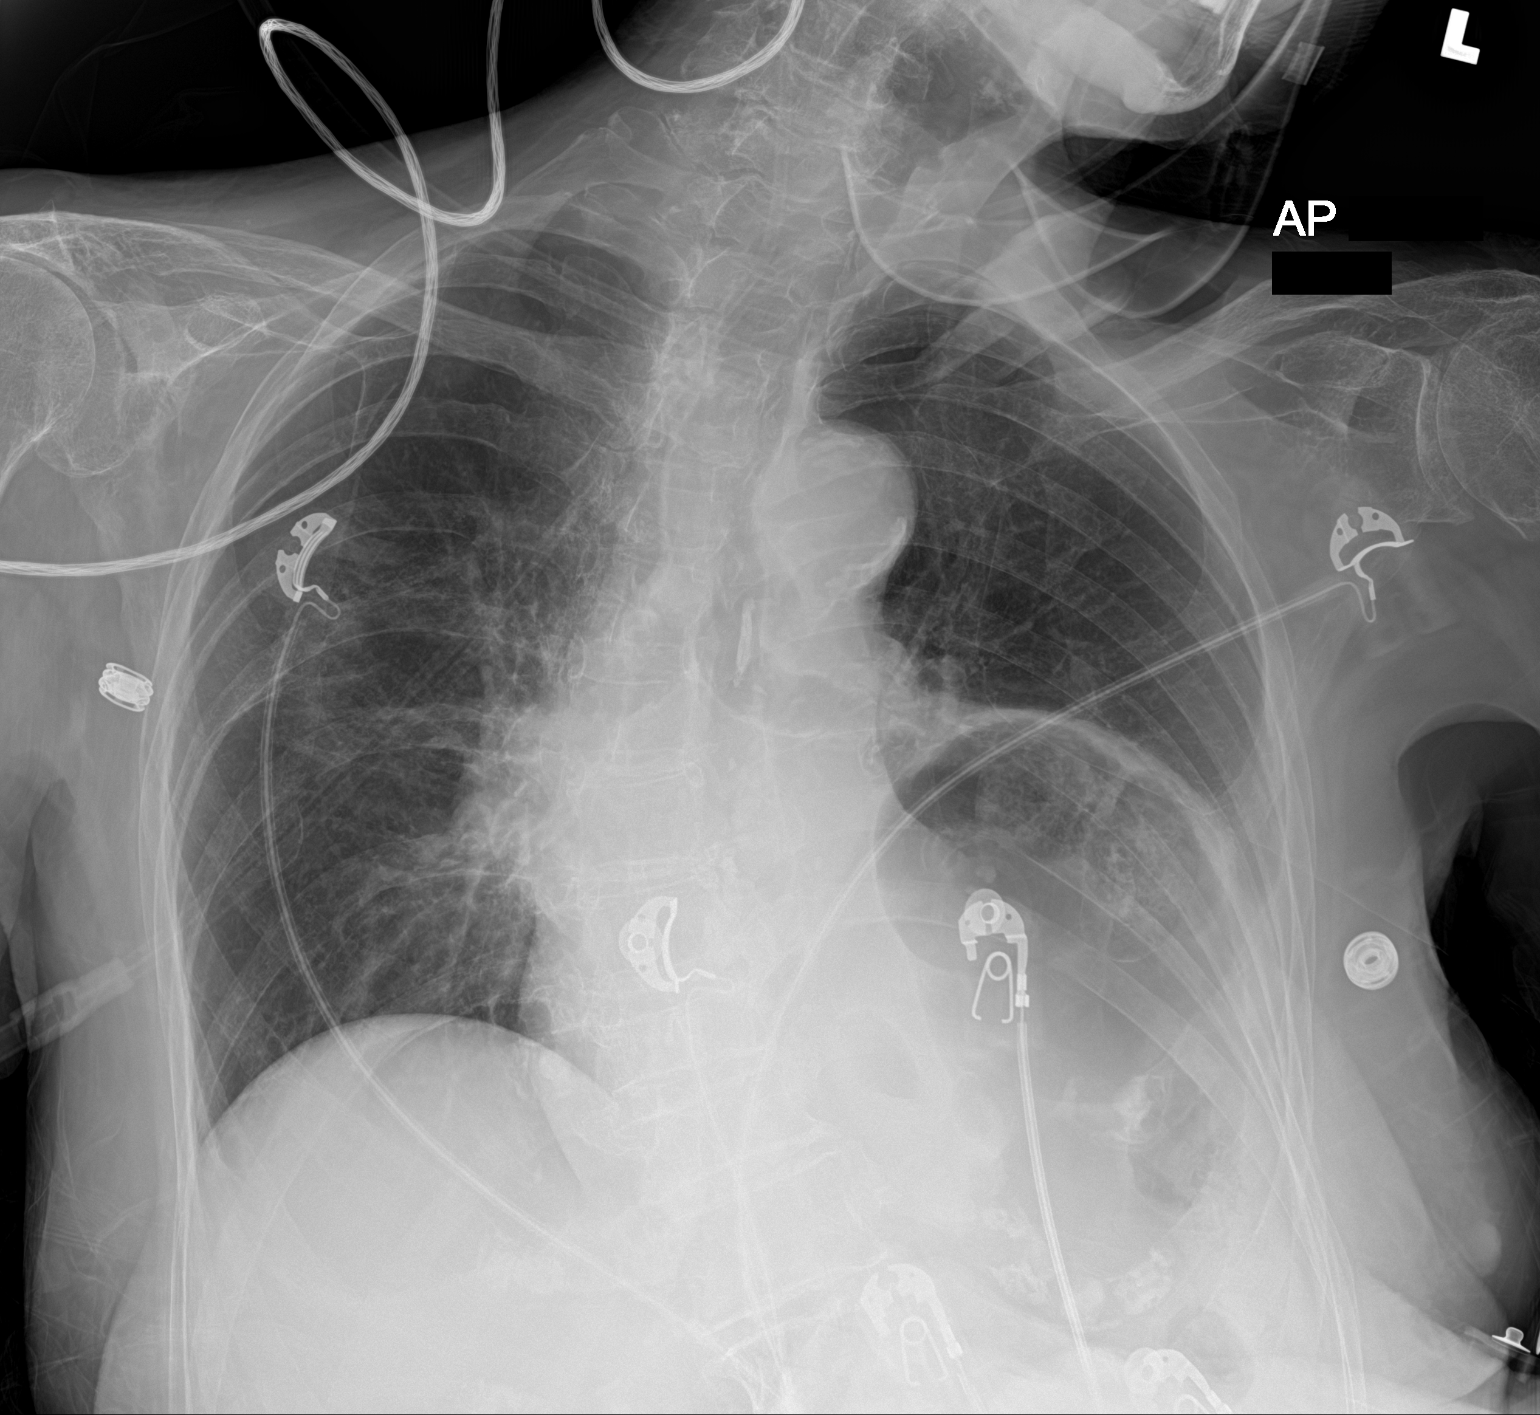

[1 of 1 positions shown; findings below may reference images not displayed]

FINDINGS: Minimal streaky atelectasis or scar at the right base. Chronic
elevation left diaphragm with left basilar atelectasis. Possible
small left pleural effusion. Stable cardiomediastinal silhouette
with aortic atherosclerosis. No pneumothorax.
IMPRESSION: 1. Possible small left pleural effusion. Chronic elevation left
diaphragm with basilar atelectasis.
